# Patient Record
Sex: Male | Born: 1980 | Race: White | Hispanic: No | Smoking: Current every day smoker
Health system: Southern US, Community
[De-identification: ages and names within clinical notes are randomized; demographics above are authoritative.]

## PROBLEM LIST (undated history)

## (undated) DIAGNOSIS — K5090 Crohn's disease, unspecified, without complications: Secondary | ICD-10-CM

## (undated) HISTORY — PX: ABDOMINAL SURGERY: SHX537

---

## 2000-11-07 ENCOUNTER — Ambulatory Visit (HOSPITAL_COMMUNITY): Admission: RE | Admit: 2000-11-07 | Discharge: 2000-11-07 | Payer: Self-pay | Admitting: Gastroenterology

## 2000-11-07 ENCOUNTER — Encounter: Payer: Self-pay | Admitting: Gastroenterology

## 2004-04-28 ENCOUNTER — Inpatient Hospital Stay (HOSPITAL_COMMUNITY): Admission: EM | Admit: 2004-04-28 | Discharge: 2004-05-02 | Payer: Self-pay | Admitting: Emergency Medicine

## 2004-06-13 ENCOUNTER — Inpatient Hospital Stay (HOSPITAL_COMMUNITY): Admission: EM | Admit: 2004-06-13 | Discharge: 2004-06-22 | Payer: Self-pay

## 2004-06-15 ENCOUNTER — Encounter (INDEPENDENT_AMBULATORY_CARE_PROVIDER_SITE_OTHER): Payer: Self-pay | Admitting: *Deleted

## 2004-12-14 ENCOUNTER — Ambulatory Visit (HOSPITAL_COMMUNITY): Admission: RE | Admit: 2004-12-14 | Discharge: 2004-12-14 | Payer: Self-pay | Admitting: Emergency Medicine

## 2004-12-28 ENCOUNTER — Encounter (HOSPITAL_COMMUNITY): Admission: RE | Admit: 2004-12-28 | Discharge: 2005-03-28 | Payer: Self-pay | Admitting: Gastroenterology

## 2005-04-13 ENCOUNTER — Emergency Department (HOSPITAL_COMMUNITY): Admission: EM | Admit: 2005-04-13 | Discharge: 2005-04-14 | Payer: Self-pay | Admitting: Emergency Medicine

## 2005-04-16 ENCOUNTER — Encounter (HOSPITAL_COMMUNITY): Admission: RE | Admit: 2005-04-16 | Discharge: 2005-07-15 | Payer: Self-pay | Admitting: Gastroenterology

## 2005-04-21 ENCOUNTER — Ambulatory Visit (HOSPITAL_COMMUNITY): Admission: RE | Admit: 2005-04-21 | Discharge: 2005-04-21 | Payer: Self-pay | Admitting: Gastroenterology

## 2005-06-15 ENCOUNTER — Emergency Department (HOSPITAL_COMMUNITY): Admission: EM | Admit: 2005-06-15 | Discharge: 2005-06-16 | Payer: Self-pay | Admitting: Emergency Medicine

## 2005-06-30 ENCOUNTER — Encounter: Admission: RE | Admit: 2005-06-30 | Discharge: 2005-06-30 | Payer: Self-pay | Admitting: Gastroenterology

## 2005-08-31 ENCOUNTER — Encounter (HOSPITAL_COMMUNITY): Admission: RE | Admit: 2005-08-31 | Discharge: 2005-11-29 | Payer: Self-pay | Admitting: Gastroenterology

## 2006-01-13 ENCOUNTER — Encounter (HOSPITAL_COMMUNITY): Admission: RE | Admit: 2006-01-13 | Discharge: 2006-04-13 | Payer: Self-pay | Admitting: Gastroenterology

## 2006-04-14 ENCOUNTER — Emergency Department (HOSPITAL_COMMUNITY): Admission: EM | Admit: 2006-04-14 | Discharge: 2006-04-14 | Payer: Self-pay | Admitting: Emergency Medicine

## 2006-06-01 ENCOUNTER — Encounter (HOSPITAL_COMMUNITY): Admission: RE | Admit: 2006-06-01 | Discharge: 2006-06-02 | Payer: Self-pay | Admitting: Gastroenterology

## 2006-07-28 ENCOUNTER — Encounter (HOSPITAL_COMMUNITY): Admission: RE | Admit: 2006-07-28 | Discharge: 2006-10-26 | Payer: Self-pay | Admitting: Gastroenterology

## 2006-11-21 ENCOUNTER — Encounter (HOSPITAL_COMMUNITY): Admission: RE | Admit: 2006-11-21 | Discharge: 2007-02-07 | Payer: Self-pay | Admitting: Gastroenterology

## 2007-04-03 ENCOUNTER — Encounter (HOSPITAL_COMMUNITY): Admission: RE | Admit: 2007-04-03 | Discharge: 2007-07-02 | Payer: Self-pay | Admitting: Gastroenterology

## 2007-07-27 ENCOUNTER — Encounter (HOSPITAL_COMMUNITY): Admission: RE | Admit: 2007-07-27 | Discharge: 2007-10-25 | Payer: Self-pay | Admitting: Gastroenterology

## 2007-10-06 ENCOUNTER — Encounter: Admission: RE | Admit: 2007-10-06 | Discharge: 2007-10-06 | Payer: Self-pay | Admitting: Gastroenterology

## 2007-11-03 ENCOUNTER — Encounter (HOSPITAL_COMMUNITY): Admission: RE | Admit: 2007-11-03 | Discharge: 2008-02-01 | Payer: Self-pay | Admitting: Gastroenterology

## 2008-03-20 ENCOUNTER — Encounter (HOSPITAL_COMMUNITY): Admission: RE | Admit: 2008-03-20 | Discharge: 2008-06-18 | Payer: Self-pay | Admitting: Gastroenterology

## 2008-07-10 ENCOUNTER — Encounter (HOSPITAL_COMMUNITY): Admission: RE | Admit: 2008-07-10 | Discharge: 2008-10-08 | Payer: Self-pay | Admitting: Gastroenterology

## 2008-11-06 ENCOUNTER — Encounter (HOSPITAL_COMMUNITY): Admission: RE | Admit: 2008-11-06 | Discharge: 2009-02-04 | Payer: Self-pay | Admitting: Gastroenterology

## 2008-11-09 ENCOUNTER — Emergency Department (HOSPITAL_BASED_OUTPATIENT_CLINIC_OR_DEPARTMENT_OTHER): Admission: EM | Admit: 2008-11-09 | Discharge: 2008-11-09 | Payer: Self-pay | Admitting: Emergency Medicine

## 2008-11-09 ENCOUNTER — Ambulatory Visit: Payer: Self-pay | Admitting: Diagnostic Radiology

## 2008-11-25 ENCOUNTER — Ambulatory Visit: Payer: Self-pay | Admitting: Diagnostic Radiology

## 2008-11-25 ENCOUNTER — Emergency Department (HOSPITAL_BASED_OUTPATIENT_CLINIC_OR_DEPARTMENT_OTHER): Admission: EM | Admit: 2008-11-25 | Discharge: 2008-11-25 | Payer: Self-pay | Admitting: Emergency Medicine

## 2009-07-10 ENCOUNTER — Encounter: Admission: RE | Admit: 2009-07-10 | Discharge: 2009-07-10 | Payer: Self-pay | Admitting: Gastroenterology

## 2009-08-30 ENCOUNTER — Emergency Department (HOSPITAL_BASED_OUTPATIENT_CLINIC_OR_DEPARTMENT_OTHER): Admission: EM | Admit: 2009-08-30 | Discharge: 2009-08-30 | Payer: Self-pay | Admitting: Emergency Medicine

## 2010-01-01 ENCOUNTER — Ambulatory Visit: Payer: Self-pay | Admitting: Interventional Radiology

## 2010-01-01 ENCOUNTER — Emergency Department (HOSPITAL_BASED_OUTPATIENT_CLINIC_OR_DEPARTMENT_OTHER): Admission: EM | Admit: 2010-01-01 | Discharge: 2010-01-01 | Payer: Self-pay | Admitting: Emergency Medicine

## 2010-07-25 ENCOUNTER — Encounter: Payer: Self-pay | Admitting: Gastroenterology

## 2010-07-29 ENCOUNTER — Emergency Department (HOSPITAL_COMMUNITY)
Admission: EM | Admit: 2010-07-29 | Discharge: 2010-07-29 | Payer: Self-pay | Source: Home / Self Care | Admitting: Emergency Medicine

## 2010-07-29 LAB — URINALYSIS, ROUTINE W REFLEX MICROSCOPIC
Bilirubin Urine: NEGATIVE
Hgb urine dipstick: NEGATIVE
Ketones, ur: NEGATIVE mg/dL
Nitrite: NEGATIVE
Specific Gravity, Urine: 1.017 (ref 1.005–1.030)
Urine Glucose, Fasting: NEGATIVE mg/dL
Urobilinogen, UA: 0.2 mg/dL (ref 0.0–1.0)
pH: 7.5 (ref 5.0–8.0)

## 2010-07-29 LAB — CBC
MCHC: 34 g/dL (ref 30.0–36.0)
MCV: 86.3 fL (ref 78.0–100.0)
Platelets: 444 10*3/uL — ABNORMAL HIGH (ref 150–400)
RBC: 4.67 MIL/uL (ref 4.22–5.81)
RDW: 14.2 % (ref 11.5–15.5)
WBC: 16.3 10*3/uL — ABNORMAL HIGH (ref 4.0–10.5)

## 2010-07-29 LAB — COMPREHENSIVE METABOLIC PANEL
ALT: 13 U/L (ref 0–53)
AST: 17 U/L (ref 0–37)
Albumin: 3.7 g/dL (ref 3.5–5.2)
BUN: 13 mg/dL (ref 6–23)
CO2: 24 mEq/L (ref 19–32)
Calcium: 9.5 mg/dL (ref 8.4–10.5)
Chloride: 105 mEq/L (ref 96–112)
Creatinine, Ser: 0.75 mg/dL (ref 0.4–1.5)
GFR calc Af Amer: >60 mL/min (ref >60)
GFR calc non Af Amer: >60 mL/min (ref >60)
Glucose, Bld: 112 mg/dL — ABNORMAL HIGH (ref 70–99)
Potassium: 4.4 mEq/L (ref 3.5–5.1)
Sodium: 138 mEq/L (ref 135–145)
Total Bilirubin: 0.6 mg/dL (ref 0.3–1.2)
Total Protein: 7.1 g/dL (ref 6.0–8.3)

## 2010-07-29 LAB — DIFFERENTIAL
Basophils Absolute: 0 10*3/uL (ref 0.0–0.1)
Basophils Relative: 0 % (ref 0–1)
Eosinophils Absolute: 0.1 10*3/uL (ref 0.0–0.7)
Eosinophils Relative: 0 % (ref 0–5)
Lymphs Abs: 1.1 10*3/uL (ref 0.7–4.0)
Monocytes Absolute: 0.9 10*3/uL (ref 0.1–1.0)
Monocytes Relative: 5 % (ref 3–12)
Neutro Abs: 14.2 10*3/uL — ABNORMAL HIGH (ref 1.7–7.7)

## 2010-08-07 ENCOUNTER — Emergency Department (HOSPITAL_COMMUNITY): Payer: BC Managed Care – PPO

## 2010-08-07 ENCOUNTER — Inpatient Hospital Stay (HOSPITAL_COMMUNITY)
Admission: EM | Admit: 2010-08-07 | Discharge: 2010-08-10 | DRG: 179 | Disposition: A | Discharge status: Home / Self Care | Payer: BC Managed Care – PPO | Attending: Internal Medicine | Admitting: Internal Medicine

## 2010-08-07 DIAGNOSIS — F17200 Nicotine dependence, unspecified, uncomplicated: Secondary | ICD-10-CM | POA: Diagnosis present

## 2010-08-07 DIAGNOSIS — D72829 Elevated white blood cell count, unspecified: Secondary | ICD-10-CM | POA: Diagnosis present

## 2010-08-07 DIAGNOSIS — K5090 Crohn's disease, unspecified, without complications: Principal | ICD-10-CM | POA: Diagnosis present

## 2010-08-07 LAB — URINALYSIS, ROUTINE W REFLEX MICROSCOPIC
Bilirubin Urine: NEGATIVE
Hgb urine dipstick: NEGATIVE
Ketones, ur: NEGATIVE mg/dL
Nitrite: NEGATIVE
Urine Glucose, Fasting: NEGATIVE mg/dL
pH: 7 (ref 5.0–8.0)

## 2010-08-07 LAB — CBC
HCT: 40.9 % (ref 39.0–52.0)
Hemoglobin: 13.4 g/dL (ref 13.0–17.0)
MCH: 29.1 pg (ref 26.0–34.0)
MCHC: 32.8 g/dL (ref 30.0–36.0)
MCV: 88.7 fL (ref 78.0–100.0)
Platelets: 385 10*3/uL (ref 150–400)
RBC: 4.61 MIL/uL (ref 4.22–5.81)
RDW: 15.6 % — ABNORMAL HIGH (ref 11.5–15.5)
WBC: 21.2 10*3/uL — ABNORMAL HIGH (ref 4.0–10.5)

## 2010-08-07 LAB — COMPREHENSIVE METABOLIC PANEL
ALT: 31 U/L (ref 0–53)
Alkaline Phosphatase: 51 U/L (ref 39–117)
CO2: 29 mEq/L (ref 19–32)
Chloride: 102 mEq/L (ref 96–112)
GFR calc non Af Amer: >60 mL/min (ref >60)
Glucose, Bld: 92 mg/dL (ref 70–99)
Potassium: 4.3 mEq/L (ref 3.5–5.1)
Sodium: 138 mEq/L (ref 135–145)
Total Bilirubin: 0.4 mg/dL (ref 0.3–1.2)

## 2010-08-07 LAB — DIFFERENTIAL
Eosinophils Absolute: 0.6 10*3/uL (ref 0.0–0.7)
Eosinophils Relative: 3 % (ref 0–5)
Lymphs Abs: 2.3 10*3/uL (ref 0.7–4.0)
Monocytes Absolute: 2.2 10*3/uL — ABNORMAL HIGH (ref 0.1–1.0)
Monocytes Relative: 10 % (ref 3–12)
Neutro Abs: 16 10*3/uL — ABNORMAL HIGH (ref 1.7–7.7)
Neutrophils Relative %: 76 % (ref 43–77)

## 2010-08-07 LAB — RAPID STREP SCREEN (MED CTR MEBANE ONLY): Streptococcus, Group A Screen (Direct): NEGATIVE

## 2010-08-07 LAB — LIPASE, BLOOD: Lipase: 32 U/L (ref 11–59)

## 2010-08-07 MED ORDER — IOHEXOL 300 MG/ML  SOLN: 100.0000 mL | Freq: Once | INTRAMUSCULAR | Status: AC | PRN

## 2010-08-08 LAB — BASIC METABOLIC PANEL
BUN: 16 mg/dL (ref 6–23)
Creatinine, Ser: 0.8 mg/dL (ref 0.4–1.5)
GFR calc non Af Amer: >60 mL/min (ref >60)
Glucose, Bld: 115 mg/dL — ABNORMAL HIGH (ref 70–99)

## 2010-08-15 NOTE — Consult Note (Signed)
NAME:  Roy Lopez, HAUSER                  ACCOUNT NO.:  [id number]  MEDICAL RECORD NO.:  [id number]           PATIENT TYPE:  I  LOCATION:  5126                         FACILITY:  [facility]  PHYSICIAN:  Gabrielle Dare. Janee Morn, M.D.DATE OF BIRTH:  Jun 22, 1981  DATE OF CONSULTATION:  08/08/2010 DATE OF DISCHARGE:                                CONSULTATION   REASON FOR CONSULTATION:  Crohn's disease with bowel obstruction.  HISTORY OF PRESENT ILLNESS:  Roy Lopez is a 30 year old white male with a 15-plus year history of Crohn's disease.  He presents to the emergency department today with gradually increasing lower abdominal pain.  He had some blood with bowel movements, but he has had no diarrhea.  This patient has had progressive difficulties over the past several weeks. He was managed for a long time by Dr. Sharrell Ku; however, he recently changed to Dr. Toma Copier at [facility] GI service.  His most recent medical regimen includes Entocort and azathioprine as well as prednisone taper.  He was prescribed Flagyl and Cipro at a recent ED visit.  He returned today to the emergency department.  CT scan was done at that time, demonstrating acute inflammation in right lower quadrant small bowel loops, consistent with active Crohn's enteritis.  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 There is no perforation or abscess formation.  There is partial bowel obstruction with some dilation of small bowel loops.  PAST MEDICAL HISTORY:  Crohn's disease.  PAST SURGICAL HISTORY:  In December 2005, he underwent small bowel resection x3 and stricturoplasty, with small bowel x4 by Dr. Glenna Fellows from my practice.  SOCIAL HISTORY:  He smokes cigarettes.  He occasionally drinks alcohol. 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  MEDICATIONS:  As listed  above.  ALLERGIES:  No known drug allergies.  REVIEW OF SYSTEMS:  GI:  As above.  PHYSICAL EXAMINATION:  VITAL SIGNS:  Temperature 98.1, blood pressure 109/64, heart rate 59, respirations 18, and saturation is 99% on room air.  GENERAL:  The patient is awake and alert.  He appears stated age. He is in no distress. HEENT:  He has mild lip edema. LUNGS:  Clear to auscultation. CARDIOVASCULAR:  The heart is regular with no murmurs.  Pulses palpable in the left chest. ABDOMEN:  Soft.  There are active bowel sounds.  He has minimal lower abdominal tenderness with no guarding.  There is no evidence of peritonitis and no masses are felt. EXTREMITIES:  No edema or deformity. SKIN:  Warm and dry.  IMAGING AND LABORATORY DATA:  CT scan, please see above.  White blood cell count 21.2.  Urinalysis negative.  Liver function tests normal. Lipase 32.  Hemoglobin 13.4.  IMPRESSION:  Crohn's disease with exacerbation and small bowel entero- entero fistulae and small bowel obstruction.  RECOMMENDATIONS: 1. Bowel rest. 2. GI evaluation. 3. Medical management for now. 4. We will follow along closely with you.  Plan was discussed in     detail with the patient.  Gabrielle Dare Janee Morn, M.D.     BET/MEDQ  D:  08/08/2010  T:  08/08/2010  Job:  [id number]  Electronically Signed by Violeta Gelinas M.D. on 08/09/2010 04:54:09 PM

## 2010-08-19 NOTE — Discharge Summary (Signed)
NAME:  Roy Lopez, Roy Lopez NO.:  [id number]  MEDICAL RECORD NO.:  [id number]           PATIENT TYPE:  LOCATION:                                 FACILITY:  PHYSICIAN:  Lonia Blood, M.D.       DATE OF BIRTH:  Nov 15, 1980  DATE OF ADMISSION: DATE OF DISCHARGE:                              DISCHARGE SUMMARY   PRIMARY CARE PROVIDER:  Dr. Derrell Lolling in [place], [place].  DISCHARGE DIAGNOSES: 1. Crohn disease flare. 2. Partial small bowel obstruction, resolved. 3. Constipation, resolved. 4. Status post multiple small bowel resections and stricturoplasty by     Dr. Glenna Fellows in December 2005.  DISCHARGE MEDICATIONS: 1. Entocort 9 mg daily. 2. Imuran 50 mg twice a day. 3. Colace 100 mg twice a day. 4. MiraLax 34 g daily as needed for constipation. 5. Nicotine patch daily. 6. Oxycodone/APAP 5/325 one to two tablets every 4 hours as needed for     pain. 7. Prednisone 10 mg three times a day.  The patient was instructed to     take 20 mg three times a day of prednisone for 4 days and then go     back to his regular dose of 10 mg three times a day.  CONDITION ON DISCHARGE:  [name] was discharged in good condition, tolerating a regular diet, alert, oriented, afebrile, passing good bowel movements, no nausea, no vomiting, no abdominal pain.  He will follow up with his primary gastroenterologist, Dr. Gaspar Garbe, at [facility].  PROCEDURE DURING THIS ADMISSION:  On August 07, 2010, the patient underwent a CT scan of abdomen and pelvis with oral intravenous contrast showing active inflammation involving the right lower quadrant and small bowel loops consistent with Crohn enteritis, evidence of small bowel stricturing with interloop fistula formation, no perforation or abscess, partial small bowel obstruction with abnormal dilatation of the proximal small bowel loops.  CONSULTATION DURING THIS ADMISSION:  The patient  was seen in consultation by Dr. Violeta Gelinas from General Surgery and Dr. Charlott Rakes from [facility] Gastroenterology.  HISTORY AND PHYSICAL:  Refer to dictated H and P which was done by Dr. Della Goo.  HOSPITAL COURSE:  Mr. Going is a 30 year old gentleman with long-acting Crohn disease who presents to emergency room with complaints of increasing abdominal pain, obstipation.  His CT of abdomen revealed presence of partial small bowel obstruction.  The patient was placed on bowel rest, and he was started on intravenous steroids.  He had a quick course of improvement with decrease of his pain, resolution of his symptoms of obstipation.  He was placed on MiraLax which resulted in his constipation clearing away and him having a few good bowel movements. His diet was advanced to a regular diet, and he was discharged in good condition on August 10, 2010.  The patient was instructed to follow up with his primary gastroenterologist, Dr. Gaspar Garbe at the [facility].     Lonia Blood, M.D.  SL/MEDQ  D:  08/11/2010  T:  08/12/2010  Job:  [id number]  cc:   Dr. Gaspar Garbe Dr. Derrell Lolling in [facility]  Electronically Signed by Lonia Blood M.D. on 08/19/2010 05:51:51 PM

## 2010-08-23 ENCOUNTER — Emergency Department (HOSPITAL_BASED_OUTPATIENT_CLINIC_OR_DEPARTMENT_OTHER)
Admission: EM | Admit: 2010-08-23 | Discharge: 2010-08-23 | Disposition: A | Discharge status: Other Acute Inpatient Hospital | Payer: BC Managed Care – PPO | Source: Home / Self Care | Attending: Emergency Medicine | Admitting: Emergency Medicine

## 2010-08-23 ENCOUNTER — Emergency Department (INDEPENDENT_AMBULATORY_CARE_PROVIDER_SITE_OTHER): Payer: BC Managed Care – PPO

## 2010-08-23 ENCOUNTER — Emergency Department (HOSPITAL_COMMUNITY)
Admission: EM | Admit: 2010-08-23 | Discharge: 2010-08-23 | Disposition: A | Discharge status: Nursing Home (Includes ICF) | Payer: BC Managed Care – PPO | Attending: Emergency Medicine | Admitting: Emergency Medicine

## 2010-08-23 DIAGNOSIS — K5090 Crohn's disease, unspecified, without complications: Secondary | ICD-10-CM | POA: Insufficient documentation

## 2010-08-23 DIAGNOSIS — R109 Unspecified abdominal pain: Secondary | ICD-10-CM | POA: Insufficient documentation

## 2010-08-23 DIAGNOSIS — F17200 Nicotine dependence, unspecified, uncomplicated: Secondary | ICD-10-CM | POA: Insufficient documentation

## 2010-08-23 DIAGNOSIS — K56609 Unspecified intestinal obstruction, unspecified as to partial versus complete obstruction: Secondary | ICD-10-CM | POA: Insufficient documentation

## 2010-08-23 DIAGNOSIS — K5989 Other specified functional intestinal disorders: Secondary | ICD-10-CM

## 2010-08-23 DIAGNOSIS — Z79899 Other long term (current) drug therapy: Secondary | ICD-10-CM | POA: Insufficient documentation

## 2010-08-23 LAB — DIFFERENTIAL
Eosinophils Absolute: 0.3 10*3/uL (ref 0.0–0.7)
Lymphs Abs: 1.8 10*3/uL (ref 0.7–4.0)
Monocytes Absolute: 1.6 10*3/uL — ABNORMAL HIGH (ref 0.1–1.0)
Neutrophils Relative %: 77 % (ref 43–77)

## 2010-08-23 LAB — COMPREHENSIVE METABOLIC PANEL
Albumin: 4.1 g/dL (ref 3.5–5.2)
Alkaline Phosphatase: 81 U/L (ref 39–117)
BUN: 18 mg/dL (ref 6–23)
Calcium: 9.4 mg/dL (ref 8.4–10.5)
Glucose, Bld: 80 mg/dL (ref 70–99)
Potassium: 4.1 mEq/L (ref 3.5–5.1)
Sodium: 144 mEq/L (ref 135–145)
Total Protein: 7.2 g/dL (ref 6.0–8.3)

## 2010-08-23 LAB — CBC
MCHC: 33.7 g/dL (ref 30.0–36.0)
MCV: 88.8 fL (ref 78.0–100.0)
Platelets: 352 10*3/uL (ref 150–400)
RDW: 16.1 % — ABNORMAL HIGH (ref 11.5–15.5)
WBC: 16.3 10*3/uL — ABNORMAL HIGH (ref 4.0–10.5)

## 2010-08-23 LAB — LIPASE, BLOOD: Lipase: 48 U/L (ref 23–300)

## 2010-08-23 MED ORDER — IOHEXOL 300 MG/ML  SOLN
100.0000 mL | Freq: Once | INTRAMUSCULAR | Status: AC | PRN
  Administered 2010-08-23: 100 mL via INTRAVENOUS

## 2010-09-01 NOTE — Consult Note (Signed)
NAME:  Roy Lopez, Roy Lopez                  ACCOUNT NO.:  [id number]  MEDICAL RECORD NO.:  [id number]           PATIENT TYPE:  E  LOCATION:  [facility]                         FACILITY:  [facility]  PHYSICIAN:  Geno Sydnor A. Breckin Zafar, M.D.DATE OF BIRTH:  1980-10-08  DATE OF CONSULTATION:  08/23/2010 DATE OF DISCHARGE:                                CONSULTATION   REQUESTING PHYSICIAN:  Dr. Anitra Lauth at [facility] Emergency [facility].  PRIMARY CARE DOCTOR:  Dr. Chesley Mires at [facility], Gastroenterology.  CHIEF COMPLAINT:  Abdominal pain.  HISTORY OF PRESENT ILLNESS:  The patient is a 30 year old male with 1 day history of severe abdominal pain that started at about lunch time yesterday.  He has been having intermittent bouts of abdominal pain, has history of Crohn disease at least diagnosed 7 years ago.  Back in 2005, he had a he says part of his colon removed and small bowel.  I have no records of that currently available to review.  Any event, he has been seeing Dr. Chesley Mires at [facility], Gastroenterology Clinic and he will be getting a workup for his abdominal pain with some tests that will be done this week.  It was unclear exactly what they were but it sounds like a small-bowel follow-through from his description.  Any event, he was complaining of 10/10 pain located in his left upper quadrant epigastrium associated with nausea, vomiting, and diarrhea.  He had a CT done at [facility] there which showed obstructive pattern with a decompressed distal colon that massively dilated small bowel with some swelling at the SMA and the SMV.  Also there appeared to be abnormalities of duodenum upon my review and some concern of possible internal herniation there.  Any event, he had a white count of 16,000.  Dr. Anitra Lauth contacted by Dr. Donell Beers and requested consultation here at [facility] and the patient was sent from [facility].  PAST  MEDICAL HISTORY:  Crohn's at least x7 years, mononucleosis.  PAST SURGICAL HISTORY:  Exploratory laparotomy, bowel resection back in 2005.  FAMILY HISTORY:  Noncontributory.  SOCIAL HISTORY:  He does smoke, he does drink, use drugs but he stopped doing that.  He is single.  REVIEW OF SYSTEMS:  Positive for abdominal pain.  Positive for nausea, positive for vomiting.  Positive for abdominal distention, otherwise negative x15.  MEDICATIONS:  Azathioprine, Entocort, and Vicodin.  ALLERGIES:  CIPRO, FLAGYL.  PHYSICAL EXAMINATION:  VITAL SIGNS:  Temperature is 98, pulse 82, blood pressure 105/61. GENERAL APPEARANCE:  Pleasant male, comfortable, NG tube in place.  No apparent distress. HEENT:  No jaundice.  Oropharynx is moist. NECK:  Supple, nontender.  Trachea midline.  No masses. PULMONARY:  Lung sounds are clear.  Chest wall excursion and work of breathing are normal. CARDIOVASCULAR:  Regular rate and rhythm without rub, murmur, or gallop. EXTREMITIES:  Warm, well-perfused, normal distal pulse exam. ABDOMEN:  Distended, tender in the epigastrium but he received Dilaudid during the last hour, no rigidity though.  It is tender throughout but mostly in left upper quadrant.  No  hernia.  No mass.  Bowel sounds are present, normoactive. EXTREMITIES:  No clubbing, cyanosis nor edema.  Muscle tone and range of motion appear grossly normal. SKIN:  Covered with multiple tattoos. NEURO:  Glasgow coma scale is 15.  Motor and sensory function are grossly intact. PSYCHIATRIC:  Mood and affect are normal.  He is appropriate.  DIAGNOSTIC STUDIES:  Reviewed his abdominal and pelvic CT scan myself this morning which showed massive dilation of small bowel and colon up through the transverse colon.  The duodenum though has typical appearance to it and some swelling around the superior mesenteric vein and artery.  There is some concern that there may be a small internal hernia there, obstruction  there.  No free fluid.  No free air.  Labs show white count of 16,000, hemoglobin 14.9, platelet count 357,000.  Sodium 144, potassium 4.1, chloride 103, CO2 30, BUN 18, creatinine 1, glucose 80, albumin 4.1.  LFTs are normal.  IMPRESSION:  Severe abdominal pain, history of Crohn's, and what appears to be small-bowel obstruction versus internal hernia with leukocytosis.  PLAN:  Given the high white count and CT findings, I am worried for potential internal hernia or small-bowel obstruction.  He has been having chronic abdominal pain for number of weeks, now it is exacerbated and it is quite severe at this point in time.  I do not think any further workup would be beneficial and given the fact that we are worried about internal hernia, exploratory laparotomy with possible lysis of adhesions, small bowel resection, and possible ostomy are all necessary I think to consider at this point in time.  I have discussed this with the patient.  Risk of bleeding, infection, wound problems, recurrent disease, missing segments of Crohn disease that may have to reoperated on, possible ileostomy or colostomy, possible injury to other internal organs to include but not exclusive of liver, stomach small- bowel, large bowel, kidneys, ureters, bladder, major blood vessels were all possibilities of surgical exploration.  I do not think there is any alternative therapy given the fact his white count is going up, his pain med requirements are going up, I do not feel observation for the testing is appropriate.  He understands all the above and wishes to proceed.     Caroljean Monsivais A. Vaniyah Lansky, M.D.     TAC/MEDQ  D:  08/23/2010  T:  08/23/2010  Job:  [id number]  cc:   Dr. Chesley Mires  Electronically Signed by Harriette Bouillon M.D. on 09/01/2010 08:43:55 AM

## 2010-09-07 NOTE — H&P (Signed)
NAME:  Roy Lopez, Roy Lopez NO.:  [id number]  MEDICAL RECORD NO.:  [id number]           PATIENT TYPE:  I  LOCATION:  5126                         FACILITY:  [facility]  PHYSICIAN:  Della Goo, M.D. DATE OF BIRTH:  [birth year]  DATE OF ADMISSION:  08/07/2010 DATE OF DISCHARGE:                             HISTORY & PHYSICAL   PRIMARY CARE PHYSICIAN:  Dr. Derrell Lolling in [place], [place].  CHIEF COMPLAINT:  Fever, abdominal pain, swollen glands.  HISTORY OF PRESENT ILLNESS:  This is a 30 year old male with a history of Crohn disease who presents to the emergency department after complaints of 10 days of abdominal pain, cramping in the left lower quadrant.  The patient reports as well having bloody stools for about 3 days consistent with his Crohn flare-up.  He states that he has had bloating, denies having any nausea or vomiting.  He was seen in the emergency department 1 week ago, was evaluated and placed on ciprofloxacin and Flagyl therapy.  His symptoms did not improve.  He was also placed on a steroid taper.  The patient continued to have problems and worsening.  He was seen in the emergency department this evening, evaluated and along with these symptoms, he also reports having swelling of his face, neck, glands, sore throat and tongue swelling.  He also states that 1 month ago he had dental work done on a tooth on the lower right side of the jaw.  He was placed on amoxicillin antibiotics at that time.  He also states that he has had fragments that have arisen from the area of the tooth excision.  The amoxicillin was for a dental abscess.  In the emergency department, the patient was evaluated for the swollen glands as well and sore throat and a strep test was performed which was found to be negative.  The patient also reports in the past that he has had mononucleosis but recovered from it.  In the emergency department, the patient underwent a CT scan of  the abdomen and pelvis, results of which found inflammation consistent with Crohn disease and a bowel obstruction.  The general surgeon on-call Dr. Violeta Gelinas was apprised of the case and consulted and is to see the patient.  The patient was referred for medical admission.  PAST MEDICAL HISTORY:  Significant for Crohn disease, also previous history of a bowel obstruction status post bowel resection of 12 inches and recent admission to [facility] for benzodiazepine and opioid dependence 21 days ago.  MEDICATIONS:  Entocort EC, azathioprine and prednisone.  ALLERGIES:  Questionable allergy to either CIPRO or FLAGYL.  Before this no known previous drug allergies.  SOCIAL HISTORY:  The patient is a smoker.  He states that he smokes anywhere from one-third a pack of cigarettes to one pack of cigarettes and has smoked for 6 years.  He denies any alcohol usage.  He denies any illicit drug usage.  FAMILY HISTORY:  Positive for inflammatory bowel disease in his mother. No history of hypertension, diabetes, or coronary artery disease or cancer in his family that he knows of.  REVIEW  OF SYSTEMS:  Pertinents mentioned above.  PHYSICAL EXAMINATION FINDINGS:  GENERAL:  This is a 30 year old well- nourished, well-developed Caucasian male in discomfort but no acute distress. VITAL SIGNS:  Temperature of 98.7, blood pressure 118/76, heart rate 75, respirations 18, O2 sats 97%. HEENT:  Normocephalic, atraumatic.  Pupils are equally round and reactive to light.  Extraocular movements are intact.  Funduscopic benign.  There is no scleral icterus.  Nares are patent bilaterally. Oropharynx is clear. NECK:  Supple.  Full range of motion.  No thyromegaly, adenopathy, or jugular venous distention. CARDIOVASCULAR:  Regular rate and rhythm.  No murmurs, gallops or rubs appreciated. LUNGS:  Clear to auscultation bilaterally.  No rales, rhonchi or wheezes. ABDOMEN:  Positive bowel sounds, soft,  nontender, nondistended.  No hepatosplenomegaly.  No rebound.  No guarding.  EXTREMITIES:  Without cyanosis, clubbing or edema. SKIN:  Multiple tattoos along the lower abdomen area. NEUROLOGIC:  Nonfocal.  LABORATORY STUDIES:  White blood cell count 21.2, hemoglobin 13.4, hematocrit 40.9, MCV 88.7, platelets 385, neutrophils 76%, lymphocytes 11%.  Urine analysis negative.  Sodium 138, potassium 4.3, chloride 102, CO2 29, BUN 18, creatinine 0.79, glucose 92, lipase 32.  CT scan of the abdomen and pelvis reveals inflammation along the right lower quadrant, small bowel loops consistent with active Crohn's enteritis, small-bowel stricturing and penetrating disease with interloop fistula formation, partial small bowel obstruction with abnormal dilatation of the proximal small bowel loops.  Also of note, there is striation of the left kidney suggestive of pyelonephritis but please note the urinalysis is negative. Group A strep screen negative.  Mono screen negative.  ASSESSMENT:  A 30 year old male being admitted with 1. Small bowel obstruction secondary to Crohn disease flare-up. 2. Crohn disease flare. 3. Abdominal pain. 4. Leukocytosis secondary to steroid therapy versus infection.     However, there is a normal white blood cell differential. 5. Lower jaw and mouth swelling, allergic reaction versus dental     abscess.  PLAN:  The patient will be admitted.  He will be admitted for further evaluation and treatment of his small bowel obstruction and abdominal pain.  The patient has been made n.p.o. and the patient has been placed on IV fluids for maintenance therapy and pain control therapy has been ordered as well as antiemetic therapy as needed.  An IV steroid taper has also been initiated.  PLAN:  A CT scan of the maxillofacial area with emphasis on the mandible will be ordered to evaluate for possible abscess formation.  Pending these results, further antibiotic therapy will be  implemented.  The patient will be monitored for further changes, further swelling.  The swelling may be due to an allergic reaction to one of the recent antibiotic therapies of Cipro or Flagyl.  The patient will be monitored for further progression of the swelling.  The patient will be placed on GI and DVT prophylaxis.    Della Goo, M.D.    HJ/MEDQ  D:  08/08/2010  T:  08/08/2010  Job:  [id number]  Electronically Signed by Della Goo M.D. on 09/07/2010 08:10:19 PM

## 2010-10-13 LAB — CBC
HCT: 38.7 % — ABNORMAL LOW (ref 39.0–52.0)
Hemoglobin: 12.8 g/dL — ABNORMAL LOW (ref 13.0–17.0)
MCV: 81.4 fL (ref 78.0–100.0)
Platelets: 420 10*3/uL — ABNORMAL HIGH (ref 150–400)
RBC: 4.75 MIL/uL (ref 4.22–5.81)
WBC: 9.9 10*3/uL (ref 4.0–10.5)

## 2010-10-13 LAB — COMPREHENSIVE METABOLIC PANEL
Albumin: 3.6 g/dL (ref 3.5–5.2)
Alkaline Phosphatase: 70 U/L (ref 39–117)
BUN: 14 mg/dL (ref 6–23)
CO2: 32 mEq/L (ref 19–32)
Chloride: 100 mEq/L (ref 96–112)
Creatinine, Ser: 0.8 mg/dL (ref 0.4–1.5)
GFR calc non Af Amer: >60 mL/min (ref >60)
Glucose, Bld: 86 mg/dL (ref 70–99)
Potassium: 4.2 mEq/L (ref 3.5–5.1)
Total Bilirubin: 0.6 mg/dL (ref 0.3–1.2)

## 2010-10-13 LAB — DIFFERENTIAL
Basophils Absolute: 0.1 10*3/uL (ref 0.0–0.1)
Basophils Relative: 1 % (ref 0–1)
Lymphocytes Relative: 21 % (ref 12–46)
Monocytes Absolute: 0.7 10*3/uL (ref 0.1–1.0)
Neutro Abs: 6.5 10*3/uL (ref 1.7–7.7)
Neutrophils Relative %: 66 % (ref 43–77)

## 2010-10-13 LAB — LIPASE, BLOOD: Lipase: 36 U/L (ref 23–300)

## 2010-10-31 ENCOUNTER — Emergency Department (INDEPENDENT_AMBULATORY_CARE_PROVIDER_SITE_OTHER): Payer: BC Managed Care – PPO

## 2010-10-31 ENCOUNTER — Emergency Department (HOSPITAL_BASED_OUTPATIENT_CLINIC_OR_DEPARTMENT_OTHER)
Admission: EM | Admit: 2010-10-31 | Discharge: 2010-11-01 | Disposition: A | Discharge status: Home / Self Care | Payer: BC Managed Care – PPO | Attending: Emergency Medicine | Admitting: Emergency Medicine

## 2010-10-31 DIAGNOSIS — R109 Unspecified abdominal pain: Secondary | ICD-10-CM

## 2010-10-31 DIAGNOSIS — K5090 Crohn's disease, unspecified, without complications: Secondary | ICD-10-CM | POA: Insufficient documentation

## 2010-10-31 LAB — COMPREHENSIVE METABOLIC PANEL
AST: 19 U/L (ref 0–37)
Albumin: 4.1 g/dL (ref 3.5–5.2)
Calcium: 9.1 mg/dL (ref 8.4–10.5)
Creatinine, Ser: 0.9 mg/dL (ref 0.4–1.5)
GFR calc Af Amer: >60 mL/min (ref >60)
Sodium: 143 mEq/L (ref 135–145)
Total Protein: 7.2 g/dL (ref 6.0–8.3)

## 2010-10-31 LAB — CBC
MCH: 29.2 pg (ref 26.0–34.0)
MCHC: 33.5 g/dL (ref 30.0–36.0)
Platelets: 317 10*3/uL (ref 150–400)
RBC: 4.52 MIL/uL (ref 4.22–5.81)

## 2010-10-31 LAB — URINALYSIS, ROUTINE W REFLEX MICROSCOPIC
Bilirubin Urine: NEGATIVE
Glucose, UA: NEGATIVE mg/dL
Hgb urine dipstick: NEGATIVE
Ketones, ur: NEGATIVE mg/dL
Nitrite: NEGATIVE
Protein, ur: NEGATIVE mg/dL
Specific Gravity, Urine: 1.016 (ref 1.005–1.030)
Urobilinogen, UA: 1 mg/dL (ref 0.0–1.0)
pH: 6 (ref 5.0–8.0)

## 2010-10-31 LAB — DIFFERENTIAL
Basophils Absolute: 0 10*3/uL (ref 0.0–0.1)
Basophils Relative: 0 % (ref 0–1)
Eosinophils Absolute: 0.6 10*3/uL (ref 0.0–0.7)
Monocytes Absolute: 0.8 10*3/uL (ref 0.1–1.0)
Monocytes Relative: 10 % (ref 3–12)
Neutrophils Relative %: 56 % (ref 43–77)

## 2010-11-20 NOTE — H&P (Signed)
NAME:  Roy Lopez, Roy Lopez                  ACCOUNT NO.:  [id number]   MEDICAL RECORD NO.:  [id number]          PATIENT TYPE:  INP   LOCATION:  1827                         FACILITY:  [facility]   PHYSICIAN:  Hillery Aldo, M.D.   DATE OF BIRTH:  1980/11/07   DATE OF ADMISSION:  04/27/2004  DATE OF DISCHARGE:                                HISTORY & PHYSICAL   CHIEF COMPLAINT:  Abdominal pain and fever.   HISTORY OF PRESENT ILLNESS:  The patient is a 30 year old male with a  longstanding history of Crohn's disease who presents with a two-day history  of increasing abdominal cramps, fever and nausea.  The cramps have gotten  worse over the past two days.  He rates the pain as a 9/10 and is crampy and  sharp in nature.  Patient states his last bowel movement was approximately  two days ago and he has noticed that he has no passed any flatus in the last  24 hours.  Nothing makes the pain better.  He has not been able to eat,  although he states he has not vomited.   PAST MEDICAL HISTORY:  1.  Crohn's disease diagnosed in 1997.  2.  History of gastritis.  3.  History of iron-deficiency anemia, resolved.  4.  History of esophagea; stricture status post dilatation.  5.  History of childhood asthma.  6.  History of left inguinal hernia repair.   ALLERGIES:  No known drug allergies.   MEDICATIONS:  1.  Entocort 3 mg daily.  2.  Zelnorm 6 mg daily.  3.  Centrum multivitamin daily.  4.  Azathioprine 100 mg b.i.d.  5.  Folic acid 1 mg daily.  6.  Pentasa 8 tablets b.i.d.   FAMILY HISTORY:  There is no family history of diabetes, colon cancer,  coronary artery disease, hypertension, hyperlipidemia, or Crohn's disease.   SOCIAL HISTORY:  Patient lives with family.  He sporadically smokes  cigarettes.  He uses occasional alcohol.   REVIEW OF SYSTEMS:  As per HPI.  All other review of systems are viewed and  found to be negative.   PHYSICAL EXAMINATION:  VITAL SIGNS:  Temperature 100.7, pulse  93,  respirations 16. Blood pressure 115/71, and O2 saturation 98% on room air.  GENERAL APPEARANCE:  In general this is well-developed, well-nourished male  in no apparent distress.  HEENT:  Normocephalic and atraumatic.  PERRL.  EOMI.  A nasogastric tube is  in place.  Oropharynx is clear.  NECK:  The neck is supple.  No thyromegaly.  No lymphadenopathy.  No jugular  venous distention.  CHEST:  Lungs are clear to auscultation bilaterally.  Good air movement.  HEART:  Regular rate and rhythm.  No murmurs, rubs or gallops.  ABDOMEN:  The abdomen is soft.  Sluggish, but present bowel sounds.  He is  slightly distended, but does not guard on exam.  RECTAL:  As per the ED physicians.  Stool is heme positive.  EXTREMITIES:  No clubbing, edema or cyanosis.  SKIN:  The skin is warm and dry to touch.  No  rashes.  NEUROLOGICAL:  Nonfocal.  PSYCHIATRIC:  Appropriate.   LABORATORY AND RADIOGRAPHIC DATA:  CBC showed a white blood cell count of  8.5, hemoglobin 14.5, hematocrit 42.7 and platelet count 404,000 with an  absolute neutrophil count of 6.4.  Urinalysis was normal, except for 15mg /dL  of ketones.  Chemistries showed sodium 137, potassium 3.8, chloride 102,  bicarb 26, BUN 13, creatinine 1.1, and glucose 100.  CT scan of the abdomen  showed distal small bowel obstruction due to thickening of small bowel loops  consistent with active Crohn's disease.  There was a small amount of free  fluid in the pelvis.  No abscess or free air was observed.   ASSESSMENT AND PLAN:  1.  Active Crohn's disease.  The patient will be started on broad spectrum antibiotic coverage with Cipro  and Flagyl.  In addition, we will start him on Solu-Medrol 80 mg  intravenously q.8.  we will consult gastroenterology tomorrow for further  evaluation and management; and, hold all p.o. medications for now.  He will  continue on bowel rest and we will keep the patient NPO, except for ice  chips.  Additionally, we will  start a proton pump inhibitor for acid  suppression.  We will also given him intravenous fluids at 100 mL/hour and  follow his electrolytes.  1.  History of iron-deficiency anemia.  The patient is not anemic on this admission.  We will continue to monitor.  1.  History of childhood asthma.  Patient's lungs are clear to auscultation.       CR/MEDQ  D:  04/28/2004  T:  04/28/2004  Job:  [id number]   cc:   Griffith Citron, M.D.  [facility]  [place] [address]  Fax: [phone]   Marlyne Beards, M.D.  [facility]

## 2010-11-20 NOTE — H&P (Signed)
NAME:  Roy Lopez, Roy Lopez                  ACCOUNT NO.:  [id number]   MEDICAL RECORD NO.:  [id number]          PATIENT TYPE:  INP   LOCATION:  1824                         FACILITY:  [facility]   PHYSICIAN:  Sharlet Salina T. Hoxworth, M.D.DATE OF BIRTH:  June 27, 1981   DATE OF ADMISSION:  06/13/2004  DATE OF DISCHARGE:                                HISTORY & PHYSICAL   CHIEF COMPLAINT:  Abdominal pain and distention.   HISTORY OF PRESENT ILLNESS:  The patient is a very pleasant 30 year old male  who has been followed by Griffith Citron, M.D. since about 1997 with  diagnosis of Crohn's disease.  This was managed very well medically, but he  has had approximately two years of gradually worsening symptoms of  intermittent, crampy abdominal pain, distention, and occasional nausea and  vomiting.  These symptoms progressed to the point where he required  admission in October of this year with CT scan findings of partial small  bowel obstruction and thickening of the terminal ileum all consistent with  Crohn's disease and obstruction.  At that point, he was treated with IV  steroids and then placed on p.o. prednisone taper as an outpatient and did  have some improvement.  He, however, continued to have significant  intermittent crampy abdominal pain and distention, more than he had  experienced in the months previously.  His symptoms have essentially  gradually worsened over the last couple of months.  He had a severe episode  of pain, distention, and vomiting last week and then this improved a little  bit, but then recurred again last night when he presented to the emergency  room.  He actually has been having daily, relatively normal bowel movements.  He notes his abdomen is definitely distended.  He describes intermittent  crampy mid abdominal pain, worse with eating.  He has occasional vomiting,  but not on a daily basis.  No fever or chills.   PAST MEDICAL HISTORY:  Surgery is significant for left  inguinal hernia  repair and tonsillectomy.  Crohn's disease is really his only medical  problem.  He has had dilatation for cervical dysphagia.   MEDICATIONS:  1.  Pentasa.  2.  Enterocort.  3.  Folic acid.  4.  Zelnorm.  5.  Azathioprine.  6.  Prednisone 20 mg a day.   ALLERGIES:  No known drug allergies.   SOCIAL HISTORY:  He is single with a fiance.  Accompanied by his parents  today.  Does not smoke cigarettes or drink alcohol.  He is employed  refilling medical oxygen tanks.   FAMILY HISTORY:  Parents are alive and well with no history of Crohn's  disease.  There is some history of heart disease and diabetes in  grandparents.   REVIEW OF SYSTEMS:  GENERAL:  Positive for some malaise.  Slight weight  loss.  HEENT:  Occasional difficulty swallowing.  Otherwise negative.  RESPIRATORY:  He had a cough last week, but this resolved with no shortness  of breath or wheezing.  CARDIAC:  No history of palpitations, chest pain.  GASTROINTESTINAL:  As above.  GENITOURINARY:  No urinary difficulty.  MUSCULOSKELETAL:  Denies joint pain, swelling.   PHYSICAL EXAMINATION:  VITAL SIGNS:  Temperature 97.9, heart rate 60, blood  pressure 113/62, respirations 12.  GENERAL:  Thin, but not emaciated, white male in no acute distress.  SKIN:  Warm and dry.  No rash or infections.  LYMPH NODES:  No cervical, supraclavicular, axillary, or inguinal nodes  palpable.  HEENT:  No mass or thyromegaly.  Sclerae nonicteric.  LUNGS:  Clear to auscultation without increased work of breathing or  wheezing.  HEART:  Regular rate and rhythm with no murmurs, no edema, and no JVD.  ABDOMEN:  Moderately distended.  Positive bowel sounds.  There was mild  diffuse tenderness without guarding.  No appreciable masses or  hepatosplenomegaly.  Well-healed left inguinal hernia.  Incision with no  palpable hernias.  EXTREMITIES:  No joint swelling or deformity.  NEUROLOGY:  Alert and oriented.  Gait normal.  Motor  and sensory grossly  normal.   LABORATORY DATA:  White count elevated at 20,000, hemoglobin 13.7,  urinalysis negative.  Electrolytes and LFT's normal with albumin 3.8.   CT scan of the head and pelvis reviewed.  This shows high grade partial  small bowel obstruction with thickening of the terminal ileum.   ASSESSMENT:  Progressive small bowel obstruction in this patient with  longstanding Crohn's.  I believe he has received maximum medical management  and has progression to near complete obstruction.  He will require  exploration and resection.  This was discussed with Dr. Kinnie Scales, who concurs.  He will be admitted, treated with IV fluids, NG suction, and plan early  surgery.  Will convert to IV steroid coverage.      Benj   BTH/MEDQ  D:  06/13/2004  T:  06/13/2004  Job:  [id number]

## 2010-11-20 NOTE — Op Note (Signed)
Roy Lopez, Roy Lopez                  ACCOUNT NO.:  [id number]   MEDICAL RECORD NO.:  [id number]          PATIENT TYPE:  INP   LOCATION:  5742                         FACILITY:  [facility]   PHYSICIAN:  Sharlet Salina T. Hoxworth, M.D.DATE OF BIRTH:  [birth year]   DATE OF PROCEDURE:  06/15/2004  DATE OF DISCHARGE:                                 OPERATIVE REPORT   PREOPERATIVE DIAGNOSIS:  Crohn's disease with small bowel obstruction.   POSTOPERATIVE DIAGNOSIS:  Crohn's disease with small bowel obstruction with  multiple areas of disease and strictures.   OPERATION PERFORMED:  Multiple small bowel resections (three) and  stricturoplasties (four).   SURGEON:  Lorne Skeens. Hoxworth, M.D.   ANESTHESIA:  General.   INDICATIONS FOR PROCEDURE:  Roy Lopez is a 30 year old white male with  history of Crohn's disease treated medically since approximately 1996 or  1997.  He has a several month history of progressive bowel obstruction,  requiring hospitalization in October of this year and then again this  weekend with acute small bowel obstruction.  He has been through full  medical management without improvement and laparotomy with resection has  been recommended and accepted.  The nature of the procedure, its indications  and risks of bleeding, infection, anastomotic leak were discusses and  understood preoperatively.  The patient is now brought to the operating room  for this procedure.   DESCRIPTION OF PROCEDURE:  The patient was brought to the operating room and  placed in supine position on the operating table and general endotracheal  anesthesia was induced.  The patient received broad spectrum preoperative  antibiotics.  PAS were in place.  The abdomen was widely sterilely prepped  and draped.  The abdomen was explored through a low midline incision just  skirting the umbilicus.  Dissection was carried down through subcutaneous  tissue to the midline fascia using cautery and the peritoneum  entered under  direct vision.  A thorough exploration was then performed.  Liver,  gallbladder, stomach, duodenum, pancreas, retroperitoneum were all  unremarkable.  The small bowel was carefully examined, beginning at the  ligament of Treitz.  The jejunum all appeared entirely normal, mildly  dilated.  At the mid to distal small bowel at about the beginning of the  ileum there were multiple areas of active Crohn's disease and also multiple  very discrete short strictures.  Beginning at the proximal was a short  stricture followed about 10 to 15 cm distal to this by 25 cm of active and  fibrotic Crohn's disease with severe stenosis over this length and this  appeared to be the major area of obstruction of the bowel just proximal to  this quite dilated.  Beyond this, another 10 or 15 cm was another short  tight stricture.  Beyond this about the mid ileum was a shorter area of  active and fibrotic Crohn's disease spanning about 10 cm in length.  Just  beyond this was another short stricture and then as we approached the  terminal ileum there was another short stricture and then about an 8 cm area  of  active stenotic Crohn's disease in the terminal ileum but about 10 to 12  cm of normal terminal ileum up to the ileocecal valve.  I elected to resect  the three areas of long segment stenotic disease and performed  stricturoplasties of the four short strictures.  The first stricture which  was a little bit tighter and longer in length was handled by performing  enterotomy at the stricture site and then using an Endo GIA 75 mm stapler  through this to create a side-to-side anastomosis bypassing the stricture.  The common enterotomy was then closed with a running 3-0 chromic and  interrupted 4-0 silk seromuscular sutures.  Following this, the long area of  resection was performed about 25 cm in the proximal  ileum, completely  taking the mesentery of this section between clamps and tying with two  silk  ties.  Large vessels were suture ligated.  A functional end-to-end  anastomosis was then created with the GIA 75 mm stapler and the common  enterotomy was closed and the specimen removed with a single firing of the  TA-60 stapler.  Mesenteric defect was closed with interrupted 3-0 silks.  Beyond this, the stricturoplasty was performed opening through the stricture  longitudinally along the antimesenteric border and then the closing the  enterotomy transversely with running 3-0 chromic and interrupted 4-0  subseromuscular sutures.  This provided a nice broad lumen of this area.  Beyond this in the mid to distal ileum, a second resection was performed of  about a 10 cm segment in an identical fashion as described for the resection  above.  Just distal to this was another moderate stricture handled with  longitudinal enterotomy and transverse closure as described above.  Working  down toward the terminal ileum was another stricture handled identically as  described above and then finally, about an 8 cm segment of stenotic Crohn's  disease in the terminal ileum was resected about 10 to 15 cm from the  ileocecal valve with the primary anastomosis identically as described above.  Following this, the bowel was again run from the ligament of Treitz to  ileocecal valve and there was no further area of significant active Crohn's  disease or stenosis throughout the small bowel.  Again the three resections  totaled 42 cm.  The abdomen was then copiously irrigated with warm saline  and complete hemostasis assured.  Following this, Tisseel tissue sealant was  used to coat all staple and suture lines.  The viscera were returned to  their anatomic position.  The midline fascia closed with running #1 PDS  beginning at either end of the incision and tied centrally.  Subcutaneous  tissue was irrigated with antibiotic solution.  The skin was closed with staples.  Sponge, needle and instrument counts were  performed.  The patient  was taken to the recovery room in good condition.      Benj   BTH/MEDQ  D:  06/15/2004  T:  06/15/2004  Job:  [id number]

## 2010-11-20 NOTE — Discharge Summary (Signed)
NAME:  Roy Lopez, Roy Lopez                  ACCOUNT NO.:  [id number]   MEDICAL RECORD NO.:  [id number]          PATIENT TYPE:  INP   LOCATION:  5018                         FACILITY:  [facility]   PHYSICIAN:  Roy Lopez, Roy LopezDATE OF BIRTH:  03-14-1981   DATE OF ADMISSION:  04/27/2004  DATE OF DISCHARGE:  05/02/2004                                 DISCHARGE SUMMARY   DIAGNOSES:  1.  Flare up of Crohns disease.  2.  Small bowel obstruction.  3.  Mild anemia of chronic disease.  4.  Hypoalbuminemia.  5.  Hyponatremia.  6.  Mild upper gastrointestinal bleeding from gastritis.   CONSULTATIONS:  Roy Lopez, M.D.   OPERATION:  None.   ALLERGIES:  None.   CODE STATUS:  Hold.   HISTORY OF PRESENT ILLNESS:  This 30 year old white male has a five year  history of Crohns disease and a two year history of increasing abdominal  cramps, fever, nausea.  He had not had a bowel movement for 48 hours or  passed any flatus.  He had not vomited.   He was first diagnosed in 1997 with Crohns disease and had been treated with  Entocort, Zelnorm, Azathioprine, folic acid and Pentasa.   PHYSICAL EXAMINATION:  VITAL SIGNS:  Temperature 100.7, pulse 93, blood  pressure 115/71.  GENERAL APPEARANCE:  He was a thin, white male, normal heart and lungs.  ABDOMEN:  Slightly distended, minimally tender around the umbilical area.  RECTAL:  Stool was heme positive.   LABORATORY DATA:  Initial white count was 8.5, hemoglobin 14.5.  CT scan of  the abdomen showed distal small bowel obstruction with thickening of small  bowel loops consistent with active Crohns disease.   HOSPITAL COURSE:  The patient was put on IV Cipro and Flagyl and Solu-Medrol  80 mg IV q.8h. and PPI with IV fluids.  Over the course of the next 48  hours, he began to feel better.  The pain subsided.  He developed some mild  hyponatremia which resolved quickly.  On October 26, he developed some  coffee-ground secretions, but started  passing gas and had a bowel movement.  He was felt to have some mild upper GI bleeding, either irritation from the  NG tube or some early gastritis.  By October 27, the patient could do  without the NG tube.  Hemoglobin had gone down to 11.5 and abdominal x-ray  showed the small bowel had cleared up.  By October 28, he was able to start  tolerating clear fluids and had had no further GI bleeding.   DISPOSITION:  The patient will be discharged on prednisone 60 mg daily and  her Omeprazole 20 mg b.i.d.  He will stay on clear or full liquids until he  is seen by Dr. Kinnie Scales on October 31 at 4:30 p.m.  At that time, Dr. Kinnie Scales  may elect to resume his previous oral medications.  I would also suggest  that the patient stay on vitamins and try to increase the amount of protein  in his diet since his albumin was only 2.6  on admission.  Laboratory work,  besides the albumin of 2.6, patient had a normal B12 and folate.  Hemoglobin  had gone down to 11.5 by discharge.   Total time for discharge, including discussion with the patient, patient's  girlfriend and patient's father, 40 minutes.       JLB/MEDQ  D:  05/02/2004  T:  05/02/2004  Job:  [id number]   cc:   Roy Lopez, M.D.  [facility] [place]  [place] [address]  Fax: [phone]

## 2010-11-20 NOTE — Discharge Summary (Signed)
Roy Lopez, Roy Lopez                  ACCOUNT NO.:  [id number]   MEDICAL RECORD NO.:  [id number]          PATIENT TYPE:  INP   LOCATION:  5742                         FACILITY:  [facility]   PHYSICIAN:  Sharlet Salina T. Hoxworth, M.D.DATE OF BIRTH:  1981/06/23   DATE OF ADMISSION:  06/13/2004  DATE OF DISCHARGE:  06/22/2004                                 DISCHARGE SUMMARY   DISCHARGE DIAGNOSIS:  Crohn's disease with obstruction.   OPERATION/PROCEDURE:  Multiple small bowel resections (3) and  stricturoplasties (4) on June 15, 2004.   HISTORY OF PRESENT ILLNESS:  This patient is a 30 year old male followed by  Dr. Ritta Slot since 1997 with a diagnosis of Crohn's disease.  He has done  very well medically until recently.  He has had intermittent symptoms for  about two years of gradually worsening crampy abdominal pain, distention,  and occasionally nausea and vomiting.  These symptoms worsened to the point  where he required admission in October 2005 with CT finding of partial small  bowel obstruction and thickening of the terminal ileum, all consistent with  Crohn's disease and obstruction.  He was treated with IV steroids and placed  on p.o. prednisone taper and had some improvement, but continued to have  intermittent crampy abdominal pain and distention.  More recently, his  symptoms have again worsened and last week had a severe episode of crampy  pain, vomiting, and distention that improved slightly, but then recurred  again the night prior to this admission when he presented to the emergency  room.  He states he has been having relatively normal bowel movements.  He  notes his abdomen is definitely distended.  He describes crampy and sharp,  mid abdominal pain worse with eating and occasional vomiting, but not on a  daily basis. No fever or chills.   PAST MEDICAL HISTORY:  Surgery significant only for left inguinal hernia  repair and tonsillectomy.  Crohn's disease is the only  medical problem.  He  has had a dilatation for cervical dysphasia.   MEDICATIONS ON ADMISSION:  1.  Pentasa 250 mg 16 tablets day taken twice daily.  2.  Entocort 9 mg p.o. daily.  3.  Folic acid daily.  4.  Zelnorm 6 mg daily.  5.  Azathioprine 1000 mg daily.  6.  Prednisone 20 mg daily.  7.  Phenergan p.r.n.  8.  Levbid 0.37 mg b.i.d. p.r.n.  9.  NuLev 0.125 1-2 sublingual q.4h p.r.n.   No known drug allergies.   SOCIAL HISTORY, FAMILY HISTORY, REVIEW OF SYSTEMS:  See detailed H&P.Marland Kitchen   PERTINENT PHYSICAL EXAMINATION:  GENERAL:  Thin, but not emaciated white  male in no acute distress.  VITAL SIGNS:  He is afebrile.  Vital signs within normal limits.  ABDOMEN:  Revealed moderate distention, mild diffuse tenderness without  guarding.  No appreciable masses or hepatosplenomegaly.   LABORATORY DATA:  White count elevated 20,000, hemoglobin 13.7.  Electrolytes and LFTs normal.   CT scan of the abdomen and pelvis shows high grade partial small bowel  obstruction with marked thickening of the terminal  ileum.   HOSPITAL COURSE:  The patient was admitted and treated with NG suction and  IV fluids with improvement of his symptoms.  The patient was discussed with  Dr. Kinnie Scales and it was agreed that he had reached maximum benefit from  medical management and had progressed to nearly complete obstruction and  that surgery was required.  He was taken to the operating room on December  12 with findings of multiple skip areas of disease from the jejunum to the  terminal ileum.  He underwent three limited resections totalling about 40 cm  and four stricturoplasties.  He tolerated this procedure well.  The first  postoperative day his Foley was discontinued and he was up out of bed and  ambulatory.  His postoperative course was quite smooth.  White count was  15,000 on the second postop day with normal hemoglobin.  Abdomen remained  quiet and NG was left in place.  NG tube was discontinued on  the third  postoperative day and he remained on ice chips.  He was begun on clear  liquids on the fourth postoperative day.  He had some mild nausea and  cramping, but was passing flatus on the sixth postop day and diet was  advanced to regular.  He tolerated this well and began having bowel  movements.  Abdomen was nondistended.  Wound healed primarily and staples  are removed.  He was ready for discharge on June 22, 2004.  At the time  of discharge, medications are the same as admission minus the Entocort and  plan will be to taper his steroids as an outpatient.  Followup is to be with  Dr. Kinnie Scales within the next two weeks and to my office in 10-14 days.  Final  pathology revealed chronic active inflammatory bowel disease consistent with  Crohn's.      BTH/MEDQ  D:  08/17/2004  T:  08/17/2004  Job:  [id number]   cc:   Griffith Citron, M.D.  [facility] [place]  [place] [address]  Fax: [phone]

## 2012-03-27 ENCOUNTER — Other Ambulatory Visit: Payer: Self-pay | Admitting: Orthopedic Surgery

## 2012-03-27 DIAGNOSIS — M545 Low back pain, unspecified: Secondary | ICD-10-CM

## 2012-03-30 ENCOUNTER — Other Ambulatory Visit: Payer: BC Managed Care – PPO

## 2012-04-07 ENCOUNTER — Ambulatory Visit
Admission: RE | Admit: 2012-04-07 | Discharge: 2012-04-07 | Disposition: A | Discharge status: Home / Self Care | Payer: BC Managed Care – PPO | Source: Ambulatory Visit | Attending: Orthopedic Surgery | Admitting: Orthopedic Surgery

## 2012-04-07 DIAGNOSIS — M545 Low back pain, unspecified: Secondary | ICD-10-CM

## 2012-05-30 ENCOUNTER — Ambulatory Visit: Payer: BC Managed Care – PPO

## 2012-09-18 ENCOUNTER — Ambulatory Visit: Payer: BC Managed Care – PPO | Attending: Orthopedic Surgery | Admitting: Physical Therapy

## 2012-09-18 DIAGNOSIS — IMO0001 Reserved for inherently not codable concepts without codable children: Secondary | ICD-10-CM | POA: Insufficient documentation

## 2012-09-18 DIAGNOSIS — M545 Low back pain, unspecified: Secondary | ICD-10-CM | POA: Insufficient documentation

## 2012-10-02 ENCOUNTER — Ambulatory Visit: Payer: BC Managed Care – PPO | Admitting: Physical Therapy

## 2012-10-04 ENCOUNTER — Ambulatory Visit: Payer: BC Managed Care – PPO | Admitting: Rehabilitation

## 2012-10-10 ENCOUNTER — Ambulatory Visit: Payer: BC Managed Care – PPO | Attending: Orthopedic Surgery | Admitting: Physical Therapy

## 2012-10-10 DIAGNOSIS — M545 Low back pain, unspecified: Secondary | ICD-10-CM | POA: Insufficient documentation

## 2012-10-10 DIAGNOSIS — IMO0001 Reserved for inherently not codable concepts without codable children: Secondary | ICD-10-CM | POA: Insufficient documentation

## 2012-10-17 ENCOUNTER — Ambulatory Visit: Payer: BC Managed Care – PPO | Admitting: Physical Therapy

## 2012-10-23 ENCOUNTER — Ambulatory Visit: Payer: BC Managed Care – PPO | Admitting: Physical Therapy

## 2012-10-25 ENCOUNTER — Ambulatory Visit: Payer: BC Managed Care – PPO | Admitting: Physical Therapy

## 2012-10-26 ENCOUNTER — Ambulatory Visit: Payer: BC Managed Care – PPO | Admitting: Physical Therapy

## 2012-10-31 ENCOUNTER — Ambulatory Visit: Payer: BC Managed Care – PPO | Admitting: Rehabilitation

## 2012-11-01 ENCOUNTER — Ambulatory Visit: Payer: BC Managed Care – PPO | Admitting: Rehabilitation

## 2012-11-07 ENCOUNTER — Ambulatory Visit: Payer: BC Managed Care – PPO | Attending: Orthopedic Surgery | Admitting: Physical Therapy

## 2012-11-07 DIAGNOSIS — IMO0001 Reserved for inherently not codable concepts without codable children: Secondary | ICD-10-CM | POA: Insufficient documentation

## 2012-11-07 DIAGNOSIS — M545 Low back pain, unspecified: Secondary | ICD-10-CM | POA: Insufficient documentation

## 2012-11-09 ENCOUNTER — Ambulatory Visit: Payer: BC Managed Care – PPO | Admitting: Physical Therapy

## 2012-11-21 ENCOUNTER — Ambulatory Visit: Payer: BC Managed Care – PPO | Admitting: Physical Therapy

## 2012-11-23 ENCOUNTER — Ambulatory Visit: Payer: BC Managed Care – PPO | Admitting: Physical Therapy

## 2012-11-29 ENCOUNTER — Ambulatory Visit: Payer: BC Managed Care – PPO | Admitting: Physical Therapy

## 2012-12-12 ENCOUNTER — Ambulatory Visit: Payer: BC Managed Care – PPO | Attending: Orthopedic Surgery | Admitting: Physical Therapy

## 2012-12-12 DIAGNOSIS — M545 Low back pain, unspecified: Secondary | ICD-10-CM | POA: Insufficient documentation

## 2012-12-12 DIAGNOSIS — IMO0001 Reserved for inherently not codable concepts without codable children: Secondary | ICD-10-CM | POA: Insufficient documentation

## 2012-12-21 ENCOUNTER — Ambulatory Visit: Payer: BC Managed Care – PPO | Admitting: Physical Therapy

## 2012-12-27 ENCOUNTER — Ambulatory Visit: Payer: BC Managed Care – PPO | Admitting: Physical Therapy

## 2013-01-17 ENCOUNTER — Ambulatory Visit: Payer: BC Managed Care – PPO | Attending: Orthopedic Surgery | Admitting: Physical Therapy

## 2013-01-17 DIAGNOSIS — IMO0001 Reserved for inherently not codable concepts without codable children: Secondary | ICD-10-CM | POA: Insufficient documentation

## 2013-01-17 DIAGNOSIS — M545 Low back pain, unspecified: Secondary | ICD-10-CM | POA: Insufficient documentation

## 2013-02-21 ENCOUNTER — Ambulatory Visit: Payer: BC Managed Care – PPO | Attending: Orthopedic Surgery | Admitting: Physical Therapy

## 2013-02-21 DIAGNOSIS — IMO0001 Reserved for inherently not codable concepts without codable children: Secondary | ICD-10-CM | POA: Insufficient documentation

## 2013-02-21 DIAGNOSIS — M545 Low back pain, unspecified: Secondary | ICD-10-CM | POA: Insufficient documentation

## 2015-01-17 ENCOUNTER — Emergency Department (HOSPITAL_BASED_OUTPATIENT_CLINIC_OR_DEPARTMENT_OTHER)
Admission: EM | Admit: 2015-01-17 | Discharge: 2015-01-18 | Disposition: A | Discharge status: Other Acute Inpatient Hospital | Payer: BLUE CROSS/BLUE SHIELD | Attending: Emergency Medicine | Admitting: Emergency Medicine

## 2015-01-17 ENCOUNTER — Encounter (HOSPITAL_BASED_OUTPATIENT_CLINIC_OR_DEPARTMENT_OTHER): Payer: Self-pay | Admitting: *Deleted

## 2015-01-17 ENCOUNTER — Emergency Department (HOSPITAL_BASED_OUTPATIENT_CLINIC_OR_DEPARTMENT_OTHER): Payer: BLUE CROSS/BLUE SHIELD

## 2015-01-17 DIAGNOSIS — Z79899 Other long term (current) drug therapy: Secondary | ICD-10-CM | POA: Insufficient documentation

## 2015-01-17 DIAGNOSIS — Z720 Tobacco use: Secondary | ICD-10-CM | POA: Insufficient documentation

## 2015-01-17 DIAGNOSIS — K5090 Crohn's disease, unspecified, without complications: Secondary | ICD-10-CM | POA: Diagnosis not present

## 2015-01-17 DIAGNOSIS — K5669 Other intestinal obstruction: Secondary | ICD-10-CM | POA: Diagnosis not present

## 2015-01-17 DIAGNOSIS — Z8719 Personal history of other diseases of the digestive system: Secondary | ICD-10-CM

## 2015-01-17 DIAGNOSIS — K56609 Unspecified intestinal obstruction, unspecified as to partial versus complete obstruction: Secondary | ICD-10-CM

## 2015-01-17 DIAGNOSIS — R109 Unspecified abdominal pain: Secondary | ICD-10-CM

## 2015-01-17 HISTORY — DX: Crohn's disease, unspecified, without complications: K50.90

## 2015-01-17 LAB — CBC WITH DIFFERENTIAL/PLATELET
BASOS ABS: 0 10*3/uL (ref 0.0–0.1)
BASOS PCT: 0 % (ref 0–1)
EOS ABS: 0.6 10*3/uL (ref 0.0–0.7)
Eosinophils Relative: 5 % (ref 0–5)
HEMATOCRIT: 43 % (ref 39.0–52.0)
Hemoglobin: 14.6 g/dL (ref 13.0–17.0)
LYMPHS ABS: 2.7 10*3/uL (ref 0.7–4.0)
Lymphocytes Relative: 25 % (ref 12–46)
MCH: 29.9 pg (ref 26.0–34.0)
MCHC: 34 g/dL (ref 30.0–36.0)
MCV: 88.1 fL (ref 78.0–100.0)
MONO ABS: 0.8 10*3/uL (ref 0.1–1.0)
Monocytes Relative: 8 % (ref 3–12)
NEUTROS ABS: 6.9 10*3/uL (ref 1.7–7.7)
NEUTROS PCT: 62 % (ref 43–77)
Platelets: 300 10*3/uL (ref 150–400)
RBC: 4.88 MIL/uL (ref 4.22–5.81)
RDW: 12.7 % (ref 11.5–15.5)
WBC: 11 10*3/uL — ABNORMAL HIGH (ref 4.0–10.5)

## 2015-01-17 LAB — URINALYSIS, ROUTINE W REFLEX MICROSCOPIC
Bilirubin Urine: NEGATIVE
Glucose, UA: NEGATIVE mg/dL
Hgb urine dipstick: NEGATIVE
Ketones, ur: NEGATIVE mg/dL
LEUKOCYTES UA: NEGATIVE
NITRITE: NEGATIVE
PH: 6.5 (ref 5.0–8.0)
Protein, ur: NEGATIVE mg/dL
SPECIFIC GRAVITY, URINE: 1.012 (ref 1.005–1.030)
UROBILINOGEN UA: 1 mg/dL (ref 0.0–1.0)

## 2015-01-17 LAB — COMPREHENSIVE METABOLIC PANEL
ALT: 19 U/L (ref 17–63)
ANION GAP: 6 (ref 5–15)
AST: 22 U/L (ref 15–41)
Albumin: 4.3 g/dL (ref 3.5–5.0)
Alkaline Phosphatase: 58 U/L (ref 38–126)
BUN: 12 mg/dL (ref 6–20)
CALCIUM: 9.4 mg/dL (ref 8.9–10.3)
CO2: 30 mmol/L (ref 22–32)
Chloride: 106 mmol/L (ref 101–111)
Creatinine, Ser: 0.94 mg/dL (ref 0.61–1.24)
GFR calc non Af Amer: >60 mL/min (ref >60)
GLUCOSE: 93 mg/dL (ref 65–99)
POTASSIUM: 3.7 mmol/L (ref 3.5–5.1)
Sodium: 142 mmol/L (ref 135–145)
Total Bilirubin: 1.3 mg/dL — ABNORMAL HIGH (ref 0.3–1.2)
Total Protein: 7.4 g/dL (ref 6.5–8.1)

## 2015-01-17 LAB — LIPASE, BLOOD: LIPASE: 10 U/L — AB (ref 22–51)

## 2015-01-17 MED ORDER — HYDROMORPHONE HCL 1 MG/ML IJ SOLN
1.0000 mg | Freq: Once | INTRAMUSCULAR | Status: AC
  Administered 2015-01-17: 1 mg via INTRAVENOUS
  Filled 2015-01-17: qty 1

## 2015-01-17 MED ORDER — MORPHINE SULFATE 4 MG/ML IJ SOLN
4.0000 mg | Freq: Once | INTRAMUSCULAR | Status: AC
  Administered 2015-01-17: 4 mg via INTRAVENOUS
  Filled 2015-01-17: qty 1

## 2015-01-17 MED ORDER — ONDANSETRON HCL 4 MG/2ML IJ SOLN
4.0000 mg | Freq: Once | INTRAMUSCULAR | Status: AC
  Administered 2015-01-17: 4 mg via INTRAVENOUS
  Filled 2015-01-17: qty 2

## 2015-01-17 NOTE — ED Notes (Signed)
Abdominal pain x 3 days

## 2015-01-17 NOTE — ED Provider Notes (Signed)
CSN: [id number]     Arrival date & time 01/17/15  2106 History  This chart was scribed for Geoffery Lyons, MD by Ronney Lion, ED Scribe. This patient was seen in room MH01/MH01 and the patient's care was started at 10:17 PM.   Chief Complaint  Patient presents with  . Abdominal Pain   Patient is a 34 y.o. male presenting with abdominal pain. The history is provided by the patient. No language interpreter was used.  Abdominal Pain Pain radiates to:  Does not radiate Pain severity:  Severe Onset quality:  Gradual Duration:  3 days Timing:  Constant Progression:  Waxing and waning Chronicity:  New Context: previous surgery   Relieved by:  None tried Worsened by:  Nothing tried Ineffective treatments:  None tried Associated symptoms: no vomiting     HPI Comments: Roy Lopez is a 34 y.o. male with a PMHx of Chrohn's diagnosed about 20 years ago, who presents to the Emergency Department complaining of severe abdominal pain that began 3 days ago. He states he recently was started on Humira for Chrohn's. Exertion, deep inspiration, and heat exacerbate the pain. He reports 2 BMs yesterday. Patient reports a history of 2 x bowel resection surgeries in 2006 and 2012. Dr. Chesley Mires at [facility] [place] follows this. He denies vomiting.  Patient notes a history of SIBO several years ago, which required hospitalization, although he adds the pain today is not as severe.  Past Medical History  Diagnosis Date  . Crohn's disease    Past Surgical History  Procedure Laterality Date  . Abdominal surgery     No family history on file. History  Substance Use Topics  . Smoking status: Current Every Day Smoker -- 0.50 packs/day    Types: Cigarettes  . Smokeless tobacco: Not on file  . Alcohol Use: Yes    Review of Systems  Gastrointestinal: Positive for abdominal pain. Negative for vomiting.  All other systems reviewed and are negative.     Allergies  Ciprofloxacin  Home Medications    Prior to Admission medications   Medication Sig Start Date End Date Taking? Authorizing Provider  Adalimumab (HUMIRA [place]) Inject into the skin.   Yes Historical Provider, MD  ALBUTEROL IN Inhale into the lungs.   Yes Historical Provider, MD  dicyclomine (BENTYL) 10 MG capsule Take 10 mg by mouth 4 (four) times daily -  before meals and at bedtime.   Yes Historical Provider, MD  RABEprazole (ACIPHEX) 20 MG tablet Take 20 mg by mouth daily.   Yes Historical Provider, MD   BP 121/85 mmHg  Pulse 70  Temp(Src) 98 F (36.7 C) (Oral)  Resp 18  Ht  (1.778 m)  Wt 155 lb (70.308 kg)  BMI 22.24 kg/m2  SpO2 100% Physical Exam  Constitutional: He is oriented to person, place, and time. He appears well-developed and well-nourished. No distress.  HENT:  Head: Normocephalic and atraumatic.  Right Ear: Hearing normal.  Left Ear: Hearing normal.  Nose: Nose normal.  Mouth/Throat: Oropharynx is clear and moist and mucous membranes are normal.  Eyes: Conjunctivae and EOM are normal. Pupils are equal, round, and reactive to light.  Neck: Normal range of motion. Neck supple.  Cardiovascular: Regular rhythm, S1 normal and S2 normal.  Exam reveals no gallop and no friction rub.   No murmur heard. Pulmonary/Chest: Effort normal and breath sounds normal. No respiratory distress. He exhibits no tenderness.  Abdominal: Soft. Normal appearance and bowel sounds are normal. There is  no hepatosplenomegaly. There is tenderness. There is no rebound, no guarding, no tenderness at McBurney's point and negative Murphy's sign. No hernia.  TTP in epigastrium, no rebound or guarding.   Musculoskeletal: Normal range of motion.  Neurological: He is alert and oriented to person, place, and time. He has normal strength. No cranial nerve deficit or sensory deficit. Coordination normal. GCS eye subscore is 4. GCS verbal subscore is 5. GCS motor subscore is 6.  Skin: Skin is warm, dry and intact. No rash noted. No  cyanosis.  Psychiatric: He has a normal mood and affect. His speech is normal and behavior is normal. Thought content normal.  Nursing note and vitals reviewed.   ED Course  Procedures (including critical care time)  DIAGNOSTIC STUDIES: Oxygen Saturation is 100% on RA, normal by my interpretation.    COORDINATION OF CARE: 10:26 PM - Discussed XR results and  treatment plan with pt at bedside which includes nasogastric decompression. Will also give pain and nausea medication. Plan to transfer pt to [facility] for admission, as pt has established GI care there. Pt verbalized understanding and agreed to plan.   Labs Review Labs Reviewed  LIPASE, BLOOD - Abnormal; Notable for the following:    Lipase 10 (*)    All other components within normal limits  COMPREHENSIVE METABOLIC PANEL - Abnormal; Notable for the following:    Total Bilirubin 1.3 (*)    All other components within normal limits  CBC WITH DIFFERENTIAL/PLATELET - Abnormal; Notable for the following:    WBC 11.0 (*)    All other components within normal limits  URINALYSIS, ROUTINE W REFLEX MICROSCOPIC (NOT AT [facility])    Imaging Review Dg Abd Acute W/chest  01/17/2015   CLINICAL DATA:  Left mid and lower abdominal pain for 3 days  EXAM: DG ABDOMEN ACUTE W/ 1V CHEST  COMPARISON:  08/23/2010  FINDINGS: Cardiomediastinal silhouette is unremarkable. No acute infiltrate or pleural effusion. No pulmonary edema. Distended small bowel loops with air-fluid level in left mid and left upper abdomen highly suspicious for bowel obstruction. No free abdominal air.  IMPRESSION: No acute disease within chest. Distended small bowel loops with air and air-fluid level in left abdomen and left mid abdomen highly suspicious for small bowel obstruction. No free abdominal air.   Electronically Signed   By: Natasha Mead M.D.   On: 01/17/2015 22:08    MDM   Final diagnoses:  Abdominal pain    Patient with history of Crohn's disease and prior bowel  resections. He presents for evaluation of abdominal pain. His laboratory studies are unremarkable, however acute abdominal series reveals a small bowel obstruction. As the patient obtains the majority of his care at [facility], hives spoken with Dr. Denny Peon who is the surgeon on call there. He agrees to accept the patient in transfer.   I personally performed the services described in this documentation, which was scribed in my presence. The recorded information has been reviewed and is accurate.      Geoffery Lyons, MD 01/17/15 2241

## 2015-11-19 ENCOUNTER — Emergency Department (HOSPITAL_BASED_OUTPATIENT_CLINIC_OR_DEPARTMENT_OTHER): Payer: BLUE CROSS/BLUE SHIELD

## 2015-11-19 ENCOUNTER — Encounter (HOSPITAL_BASED_OUTPATIENT_CLINIC_OR_DEPARTMENT_OTHER): Payer: Self-pay | Admitting: *Deleted

## 2015-11-19 ENCOUNTER — Emergency Department (HOSPITAL_BASED_OUTPATIENT_CLINIC_OR_DEPARTMENT_OTHER)
Admission: EM | Admit: 2015-11-19 | Discharge: 2015-11-19 | Disposition: A | Discharge status: Home / Self Care | Payer: BLUE CROSS/BLUE SHIELD | Attending: Emergency Medicine | Admitting: Emergency Medicine

## 2015-11-19 DIAGNOSIS — Z9889 Other specified postprocedural states: Secondary | ICD-10-CM | POA: Insufficient documentation

## 2015-11-19 DIAGNOSIS — J189 Pneumonia, unspecified organism: Secondary | ICD-10-CM

## 2015-11-19 DIAGNOSIS — J181 Lobar pneumonia, unspecified organism: Secondary | ICD-10-CM

## 2015-11-19 DIAGNOSIS — K5900 Constipation, unspecified: Secondary | ICD-10-CM

## 2015-11-19 DIAGNOSIS — F17210 Nicotine dependence, cigarettes, uncomplicated: Secondary | ICD-10-CM | POA: Diagnosis not present

## 2015-11-19 DIAGNOSIS — R1012 Left upper quadrant pain: Secondary | ICD-10-CM | POA: Diagnosis present

## 2015-11-19 LAB — COMPREHENSIVE METABOLIC PANEL
ALT: 11 U/L — AB (ref 17–63)
AST: 16 U/L (ref 15–41)
Albumin: 4.5 g/dL (ref 3.5–5.0)
Alkaline Phosphatase: 67 U/L (ref 38–126)
Anion gap: 6 (ref 5–15)
BILIRUBIN TOTAL: 0.8 mg/dL (ref 0.3–1.2)
BUN: 12 mg/dL (ref 6–20)
CALCIUM: 9.4 mg/dL (ref 8.9–10.3)
CHLORIDE: 102 mmol/L (ref 101–111)
CO2: 31 mmol/L (ref 22–32)
CREATININE: 0.82 mg/dL (ref 0.61–1.24)
GFR calc non Af Amer: >60 mL/min (ref >60)
GLUCOSE: 72 mg/dL (ref 65–99)
Potassium: 3.5 mmol/L (ref 3.5–5.1)
Sodium: 139 mmol/L (ref 135–145)
Total Protein: 7.6 g/dL (ref 6.5–8.1)

## 2015-11-19 LAB — CBC WITH DIFFERENTIAL/PLATELET
Basophils Absolute: 0 10*3/uL (ref 0.0–0.1)
Basophils Relative: 0 %
Eosinophils Absolute: 0.7 10*3/uL (ref 0.0–0.7)
Eosinophils Relative: 6 %
HEMATOCRIT: 41.7 % (ref 39.0–52.0)
Hemoglobin: 14 g/dL (ref 13.0–17.0)
LYMPHS ABS: 3 10*3/uL (ref 0.7–4.0)
LYMPHS PCT: 25 %
MCH: 29.5 pg (ref 26.0–34.0)
MCHC: 33.6 g/dL (ref 30.0–36.0)
MCV: 88 fL (ref 78.0–100.0)
Monocytes Absolute: 1.1 10*3/uL — ABNORMAL HIGH (ref 0.1–1.0)
Monocytes Relative: 9 %
Neutro Abs: 7.3 10*3/uL (ref 1.7–7.7)
Neutrophils Relative %: 60 %
PLATELETS: 363 10*3/uL (ref 150–400)
RBC: 4.74 MIL/uL (ref 4.22–5.81)
RDW: 13.1 % (ref 11.5–15.5)
WBC: 12.1 10*3/uL — AB (ref 4.0–10.5)

## 2015-11-19 MED ORDER — ONDANSETRON HCL 4 MG/2ML IJ SOLN
INTRAMUSCULAR | Status: AC
Start: 2015-11-19 — End: 2015-11-19
  Administered 2015-11-19: 4 mg
  Filled 2015-11-19: qty 2

## 2015-11-19 MED ORDER — AZITHROMYCIN 250 MG PO TABS
500.0000 mg | ORAL_TABLET | Freq: Once | ORAL | Status: AC
  Administered 2015-11-19: 500 mg via ORAL
  Filled 2015-11-19: qty 2

## 2015-11-19 MED ORDER — DICYCLOMINE HCL 20 MG PO TABS: 20.0000 mg | ORAL_TABLET | Freq: Two times a day (BID) | ORAL | Status: DC

## 2015-11-19 MED ORDER — HYDROMORPHONE HCL 1 MG/ML IJ SOLN
INTRAMUSCULAR | Status: AC
  Administered 2015-11-19: 1 mg
  Filled 2015-11-19: qty 1

## 2015-11-19 MED ORDER — GI COCKTAIL ~~LOC~~
ORAL | Status: AC
  Administered 2015-11-19: 30 mL
  Filled 2015-11-19: qty 30

## 2015-11-19 MED ORDER — AZITHROMYCIN 250 MG PO TABS
ORAL_TABLET | ORAL | Status: DC
Start: 2015-11-19 — End: 2016-03-21

## 2015-11-19 NOTE — Discharge Instructions (Signed)
Community-Acquired Pneumonia, Adult °Pneumonia is an infection of the lungs. One type of pneumonia can happen while a person is in a hospital. A different type can happen when a person is not in a hospital (community-acquired pneumonia). It is easy for this kind to spread from person to person. It can spread to you if you breathe near an infected person who coughs or sneezes. Some symptoms include: °· A dry cough. °· A wet (productive) cough. °· Fever. °· Sweating. °· Chest pain. °HOME CARE °· Take over-the-counter and prescription medicines only as told by your doctor. °¨ Only take cough medicine if you are losing sleep. °¨ If you were prescribed an antibiotic medicine, take it as told by your doctor. Do not stop taking the antibiotic even if you start to feel better. °· Sleep with your head and neck raised (elevated). You can do this by putting a few pillows under your head, or you can sleep in a recliner. °· Do not use tobacco products. These include cigarettes, chewing tobacco, and e-cigarettes. If you need help quitting, ask your doctor. °· Drink enough water to keep your pee (urine) clear or pale yellow. °A shot (vaccine) can help prevent pneumonia. Shots are often suggested for: °· People older than 35 years of age. °· People older than 35 years of age: °¨ Who are having cancer treatment. °¨ Who have long-term (chronic) lung disease. °¨ Who have problems with their body's defense system (immune system). °You may also prevent pneumonia if you take these actions: °· Get the flu (influenza) shot every year. °· Go to the dentist as often as told. °· Wash your hands often. If soap and water are not available, use hand sanitizer. °GET HELP IF: °· You have a fever. °· You lose sleep because your cough medicine does not help. °GET HELP RIGHT AWAY IF: °· You are short of breath and it gets worse. °· You have more chest pain. °· Your sickness gets worse. This is very serious if: °¨ You are an older adult. °¨ Your  body's defense system is weak. °· You cough up blood. °  °This information is not intended to replace advice given to you by your health care provider. Make sure you discuss any questions you have with your health care provider. °  °Document Released: 12/08/2007 Document Revised: 03/12/2015 Document Reviewed: 10/16/2014 °Elsevier Interactive Patient Education ©2016 [employer] ° °Constipation, Adult °Constipation is when a person has fewer than three bowel movements a week, has difficulty having a bowel movement, or has stools that are dry, hard, or larger than normal. As people grow older, constipation is more common. A low-fiber diet, not taking in enough fluids, and taking certain medicines may make constipation worse.  °CAUSES  °· Certain medicines, such as antidepressants, pain medicine, iron supplements, antacids, and water pills.   °· Certain diseases, such as diabetes, irritable bowel syndrome (IBS), thyroid disease, or depression.   °· Not drinking enough water.   °· Not eating enough fiber-rich foods.   °· Stress or travel.   °· Lack of physical activity or exercise.   °· Ignoring the urge to have a bowel movement.   °· Using laxatives too much.   °SIGNS AND SYMPTOMS  °· Having fewer than three bowel movements a week.   °· Straining to have a bowel movement.   °· Having stools that are hard, dry, or larger than normal.   °· Feeling full or bloated.   °· Pain in the lower abdomen.   °· Not feeling relief after having a bowel movement.   °  DIAGNOSIS  °Your health care provider will take a medical history and perform a physical exam. Further testing may be done for severe constipation. Some tests may include: °· A barium enema X-ray to examine your rectum, colon, and, sometimes, your small intestine.   °· A sigmoidoscopy to examine your lower colon.   °· A colonoscopy to examine your entire colon. °TREATMENT  °Treatment will depend on the severity of your constipation and what is causing it. Some dietary  treatments include drinking more fluids and eating more fiber-rich foods. Lifestyle treatments may include regular exercise. If these diet and lifestyle recommendations do not help, your health care provider may recommend taking over-the-counter laxative medicines to help you have bowel movements. Prescription medicines may be prescribed if over-the-counter medicines do not work.  °HOME CARE INSTRUCTIONS  °· Eat foods that have a lot of fiber, such as fruits, vegetables, whole grains, and beans. °· Limit foods high in fat and processed sugars, such as french fries, hamburgers, cookies, candies, and soda.   °· A fiber supplement may be added to your diet if you cannot get enough fiber from foods.   °· Drink enough fluids to keep your urine clear or pale yellow.   °· Exercise regularly or as directed by your health care provider.   °· Go to the restroom when you have the urge to go. Do not hold it.   °· Only take over-the-counter or prescription medicines as directed by your health care provider. Do not take other medicines for constipation without talking to your health care provider first.   °SEEK IMMEDIATE MEDICAL CARE IF:  °· You have bright red blood in your stool.   °· Your constipation lasts for more than 4 days or gets worse.   °· You have abdominal or rectal pain.   °· You have thin, pencil-like stools.   °· You have unexplained weight loss. °MAKE SURE YOU:  °· Understand these instructions. °· Will watch your condition. °· Will get help right away if you are not doing well or get worse. °  °This information is not intended to replace advice given to you by your health care provider. Make sure you discuss any questions you have with your health care provider. °  °Document Released: 03/19/2004 Document Revised: 07/12/2014 Document Reviewed: 04/02/2013 °Elsevier Interactive Patient Education ©2016 Elsevier Inc. ° °

## 2015-11-19 NOTE — ED Notes (Signed)
Pt c/o left upper abd pain x 3 days

## 2015-11-19 NOTE — ED Provider Notes (Addendum)
CSN: [id number]     Arrival date & time 11/19/15  0012 History   First MD Initiated Contact with Patient 11/19/15 0020     Chief Complaint  Patient presents with  . Abdominal Pain     (Consider location/radiation/quality/duration/timing/severity/associated sxs/prior Treatment) Patient is a 35 y.o. male presenting with abdominal pain. The history is provided by the patient.  Abdominal Pain Pain location:  LUQ Pain quality: cramping   Pain radiates to:  Does not radiate Pain severity:  Moderate Onset quality:  Gradual Timing:  Constant Progression:  Unchanged Chronicity:  Recurrent Context: not alcohol use   Relieved by:  Nothing Worsened by:  Nothing tried Ineffective treatments:  None tried Associated symptoms: constipation   Associated symptoms: no anorexia, no chest pain, no chills, no cough, no dysuria, no fever, no nausea, no shortness of breath and no vomiting   Risk factors: has not had multiple surgeries     Past Medical History  Diagnosis Date  . Crohn's disease [facility])    Past Surgical History  Procedure Laterality Date  . Abdominal surgery     History reviewed. No pertinent family history. Social History  Substance Use Topics  . Smoking status: Current Every Day Smoker -- 0.50 packs/day    Types: Cigarettes  . Smokeless tobacco: None  . Alcohol Use: Yes    Review of Systems  Constitutional: Negative for fever and chills.  Respiratory: Negative for cough and shortness of breath.   Cardiovascular: Negative for chest pain.  Gastrointestinal: Positive for abdominal pain and constipation. Negative for nausea, vomiting and anorexia.  Genitourinary: Negative for dysuria.  All other systems reviewed and are negative.     Allergies  Ciprofloxacin  Home Medications   Prior to Admission medications   Medication Sig Start Date End Date Taking? Authorizing Provider  Adalimumab (HUMIRA [place]) Inject into the skin.    Historical Provider, MD  ALBUTEROL IN Inhale  into the lungs.    Historical Provider, MD  dicyclomine (BENTYL) 10 MG capsule Take 10 mg by mouth 4 (four) times daily -  before meals and at bedtime.    Historical Provider, MD  RABEprazole (ACIPHEX) 20 MG tablet Take 20 mg by mouth daily.    Historical Provider, MD   BP 122/85 mmHg  Pulse 62  Temp(Src) 98.6 F (37 C) (Oral)  Resp 16  Ht 5\' 10"  (1.778 m)  Wt 150 lb (68.04 kg)  BMI 21.52 kg/m2  SpO2 100% Physical Exam  Constitutional: He is oriented to person, place, and time. He appears well-developed and well-nourished. No distress.  HENT:  Head: Normocephalic and atraumatic.  Mouth/Throat: Oropharynx is clear and moist.  Eyes: Conjunctivae are normal. Pupils are equal, round, and reactive to light.  Neck: Normal range of motion. Neck supple.  Cardiovascular: Normal rate, regular rhythm and intact distal pulses.   Pulmonary/Chest: Effort normal and breath sounds normal. He has no wheezes. He has no rales.  Abdominal: Soft. There is no tenderness. There is no rigidity, no rebound, no guarding, no tenderness at McBurney's point and negative Murphy's sign.  Gassy, constipation  Musculoskeletal: Normal range of motion.  Neurological: He is alert and oriented to person, place, and time.  Skin: Skin is warm and dry.  Psychiatric: He has a normal mood and affect.    ED Course  Procedures (including critical care time) Labs Review Labs Reviewed  CBC WITH DIFFERENTIAL/PLATELET - Abnormal; Notable for the following:    WBC 12.1 (*)    Monocytes Absolute  1.1 (*)    All other components within normal limits  COMPREHENSIVE METABOLIC PANEL - Abnormal; Notable for the following:    ALT 11 (*)    All other components within normal limits  URINALYSIS, ROUTINE W REFLEX MICROSCOPIC (NOT AT [facility])    Imaging Review Dg Abd Acute W/chest  11/19/2015  CLINICAL DATA:  Left abdominal pain for 3 days. Previous small bowel surgeries. History of Crohn disease. EXAM: DG ABDOMEN ACUTE W/ 1V CHEST  COMPARISON:  01/17/2015 FINDINGS: Patchy nodular infiltrates demonstrated in the right lung, new since previous study. This is likely inflammatory, but pulmonary nodules are not excluded and follow-up in 3-4 weeks after treatment of acute process is recommended. Left lung is clear. Normal heart size and pulmonary vascularity. Gas and stool in the colon with gas-filled mid abdominal small bowel. No small or large bowel distention. Postoperative changes in the upper abdomen. No radiopaque stones. No free intra-abdominal air. No abnormal air-fluid levels. Visualized bones appear intact. IMPRESSION: Nonspecific nodular infiltrates in the right mid lung, new since previous study. This may represent inflammatory process but followup in 3-4 weeks after treatment of acute process is recommended. Normal nonobstructive bowel gas pattern. Electronically Signed   By: Burman Nieves M.D.   On: 11/19/2015 03:48   I have personally reviewed and evaluated these images and lab results as part of my medical decision-making.   EKG Interpretation None      MDM   Final diagnoses:  None   Filed Vitals:   11/19/15 0017  BP: 122/85  Pulse: 62  Temp: 98.6 F (37 C)  Resp: 16   Results for orders placed or performed during the hospital encounter of 11/19/15  CBC with Differential/Platelet  Result Value Ref Range   WBC 12.1 (H) 4.0 - 10.5 K/uL   RBC 4.74 4.22 - 5.81 MIL/uL   Hemoglobin 14.0 13.0 - 17.0 g/dL   HCT 40.9 81.1 - 91.4 %   MCV 88.0 78.0 - 100.0 fL   MCH 29.5 26.0 - 34.0 pg   MCHC 33.6 30.0 - 36.0 g/dL   RDW 78.2 95.6 - 21.3 %   Platelets 363 150 - 400 K/uL   Neutrophils Relative % 60 %   Neutro Abs 7.3 1.7 - 7.7 K/uL   Lymphocytes Relative 25 %   Lymphs Abs 3.0 0.7 - 4.0 K/uL   Monocytes Relative 9 %   Monocytes Absolute 1.1 (H) 0.1 - 1.0 K/uL   Eosinophils Relative 6 %   Eosinophils Absolute 0.7 0.0 - 0.7 K/uL   Basophils Relative 0 %   Basophils Absolute 0.0 0.0 - 0.1 K/uL   Comprehensive metabolic panel  Result Value Ref Range   Sodium 139 135 - 145 mmol/L   Potassium 3.5 3.5 - 5.1 mmol/L   Chloride 102 101 - 111 mmol/L   CO2 31 22 - 32 mmol/L   Glucose, Bld 72 65 - 99 mg/dL   BUN 12 6 - 20 mg/dL   Creatinine, Ser 0.86 0.61 - 1.24 mg/dL   Calcium 9.4 8.9 - 57.8 mg/dL   Total Protein 7.6 6.5 - 8.1 g/dL   Albumin 4.5 3.5 - 5.0 g/dL   AST 16 15 - 41 U/L   ALT 11 (L) 17 - 63 U/L   Alkaline Phosphatase 67 38 - 126 U/L   Total Bilirubin 0.8 0.3 - 1.2 mg/dL   GFR calc non Af Amer >60 >60 mL/min   GFR calc Af Amer >60 >60 mL/min   Anion  gap 6 5 - 15   Dg Abd Acute W/chest  11/19/2015  CLINICAL DATA:  Left abdominal pain for 3 days. Previous small bowel surgeries. History of Crohn disease. EXAM: DG ABDOMEN ACUTE W/ 1V CHEST COMPARISON:  01/17/2015 FINDINGS: Patchy nodular infiltrates demonstrated in the right lung, new since previous study. This is likely inflammatory, but pulmonary nodules are not excluded and follow-up in 3-4 weeks after treatment of acute process is recommended. Left lung is clear. Normal heart size and pulmonary vascularity. Gas and stool in the colon with gas-filled mid abdominal small bowel. No small or large bowel distention. Postoperative changes in the upper abdomen. No radiopaque stones. No free intra-abdominal air. No abnormal air-fluid levels. Visualized bones appear intact. IMPRESSION: Nonspecific nodular infiltrates in the right mid lung, new since previous study. This may represent inflammatory process but followup in 3-4 weeks after treatment of acute process is recommended. Normal nonobstructive bowel gas pattern. Electronically Signed   By: Burman Nieves M.D.   On: 11/19/2015 03:48    Medications  ondansetron (ZOFRAN) 4 MG/2ML injection (4 mg  Given 11/19/15 0140)  HYDROmorphone (DILAUDID) 1 MG/ML injection (1 mg  Given 11/19/15 0140)  gi cocktail suspension (30 mLs  Given 11/19/15 0140)    Will treat for infiltrate with  azithromycin and have patient follow up with his PMD in one day and recheck chest xray in 1 week to exclude nodules.  No cough no fever but given crohns will start zpak Strict return precautions for fever, cough SOB, CP or worsening pain.  Continue home oxycodone.  Start miralax QAM.  Patient and family verbalize understanding and agree to follow up    Taijon Vink, MD 11/19/15 0412  Emlyn Maves, MD 11/19/15 1610

## 2016-03-20 DIAGNOSIS — F17210 Nicotine dependence, cigarettes, uncomplicated: Secondary | ICD-10-CM | POA: Insufficient documentation

## 2016-03-20 DIAGNOSIS — K5000 Crohn's disease of small intestine without complications: Secondary | ICD-10-CM | POA: Diagnosis not present

## 2016-03-20 DIAGNOSIS — Z79899 Other long term (current) drug therapy: Secondary | ICD-10-CM | POA: Diagnosis not present

## 2016-03-20 DIAGNOSIS — R1032 Left lower quadrant pain: Secondary | ICD-10-CM | POA: Diagnosis present

## 2016-03-21 ENCOUNTER — Emergency Department (HOSPITAL_BASED_OUTPATIENT_CLINIC_OR_DEPARTMENT_OTHER): Payer: BLUE CROSS/BLUE SHIELD

## 2016-03-21 ENCOUNTER — Encounter (HOSPITAL_BASED_OUTPATIENT_CLINIC_OR_DEPARTMENT_OTHER): Payer: Self-pay | Admitting: *Deleted

## 2016-03-21 ENCOUNTER — Emergency Department (HOSPITAL_BASED_OUTPATIENT_CLINIC_OR_DEPARTMENT_OTHER)
Admission: EM | Admit: 2016-03-21 | Discharge: 2016-03-21 | Discharge status: Against Medical Advice | Payer: BLUE CROSS/BLUE SHIELD | Attending: Emergency Medicine | Admitting: Emergency Medicine

## 2016-03-21 DIAGNOSIS — K5000 Crohn's disease of small intestine without complications: Secondary | ICD-10-CM

## 2016-03-21 LAB — COMPREHENSIVE METABOLIC PANEL
ALT: 11 U/L — ABNORMAL LOW (ref 17–63)
AST: 14 U/L — AB (ref 15–41)
Albumin: 3.8 g/dL (ref 3.5–5.0)
Alkaline Phosphatase: 58 U/L (ref 38–126)
Anion gap: 6 (ref 5–15)
BUN: 13 mg/dL (ref 6–20)
CO2: 30 mmol/L (ref 22–32)
CREATININE: 0.79 mg/dL (ref 0.61–1.24)
Calcium: 9 mg/dL (ref 8.9–10.3)
Chloride: 103 mmol/L (ref 101–111)
GFR calc Af Amer: >60 mL/min (ref >60)
GLUCOSE: 113 mg/dL — AB (ref 65–99)
POTASSIUM: 3.3 mmol/L — AB (ref 3.5–5.1)
Sodium: 139 mmol/L (ref 135–145)
Total Bilirubin: 0.6 mg/dL (ref 0.3–1.2)
Total Protein: 6.7 g/dL (ref 6.5–8.1)

## 2016-03-21 LAB — CBC WITH DIFFERENTIAL/PLATELET
BASOS ABS: 0.1 10*3/uL (ref 0.0–0.1)
BASOS PCT: 0 %
EOS PCT: 5 %
Eosinophils Absolute: 0.6 10*3/uL (ref 0.0–0.7)
HCT: 38.6 % — ABNORMAL LOW (ref 39.0–52.0)
Hemoglobin: 12.9 g/dL — ABNORMAL LOW (ref 13.0–17.0)
LYMPHS PCT: 29 %
Lymphs Abs: 3.8 10*3/uL (ref 0.7–4.0)
MCH: 29.5 pg (ref 26.0–34.0)
MCHC: 33.4 g/dL (ref 30.0–36.0)
MCV: 88.3 fL (ref 78.0–100.0)
Monocytes Absolute: 1 10*3/uL (ref 0.1–1.0)
Monocytes Relative: 7 %
Neutro Abs: 7.6 10*3/uL (ref 1.7–7.7)
Neutrophils Relative %: 59 %
PLATELETS: 332 10*3/uL (ref 150–400)
RBC: 4.37 MIL/uL (ref 4.22–5.81)
RDW: 12.6 % (ref 11.5–15.5)
WBC: 13 10*3/uL — AB (ref 4.0–10.5)

## 2016-03-21 LAB — LIPASE, BLOOD: LIPASE: 22 U/L (ref 11–51)

## 2016-03-21 LAB — I-STAT CG4 LACTIC ACID, ED: LACTIC ACID, VENOUS: 0.58 mmol/L (ref 0.5–1.9)

## 2016-03-21 MED ORDER — HYDROMORPHONE HCL 1 MG/ML IJ SOLN
1.0000 mg | INTRAMUSCULAR | Status: DC | PRN
  Administered 2016-03-21 (×2): 1 mg via INTRAVENOUS
  Filled 2016-03-21 (×2): qty 1

## 2016-03-21 MED ORDER — IOPAMIDOL (ISOVUE-300) INJECTION 61%
100.0000 mL | Freq: Once | INTRAVENOUS | Status: AC | PRN
  Administered 2016-03-21: 100 mL via INTRAVENOUS

## 2016-03-21 MED ORDER — PREDNISONE 20 MG PO TABS: ORAL_TABLET | ORAL | 0 refills | Status: AC

## 2016-03-21 MED ORDER — HYDROMORPHONE HCL 1 MG/ML IJ SOLN
1.0000 mg | Freq: Once | INTRAMUSCULAR | Status: AC
  Administered 2016-03-21: 1 mg via INTRAVENOUS
  Filled 2016-03-21: qty 1

## 2016-03-21 MED ORDER — METRONIDAZOLE 500 MG PO TABS
500.0000 mg | ORAL_TABLET | Freq: Once | ORAL | Status: AC
  Administered 2016-03-21: 500 mg via ORAL
  Filled 2016-03-21: qty 1

## 2016-03-21 MED ORDER — PREDNISONE 50 MG PO TABS
60.0000 mg | ORAL_TABLET | Freq: Once | ORAL | Status: AC
  Administered 2016-03-21: 60 mg via ORAL
  Filled 2016-03-21: qty 1

## 2016-03-21 MED ORDER — METRONIDAZOLE 500 MG PO TABS: 500.0000 mg | ORAL_TABLET | Freq: Two times a day (BID) | ORAL | 0 refills | Status: AC

## 2016-03-21 MED ORDER — FENTANYL CITRATE (PF) 100 MCG/2ML IJ SOLN
100.0000 ug | Freq: Once | INTRAMUSCULAR | Status: AC
  Administered 2016-03-21: 100 ug via INTRAVENOUS
  Filled 2016-03-21: qty 2

## 2016-03-21 MED ORDER — ONDANSETRON HCL 4 MG/2ML IJ SOLN
4.0000 mg | Freq: Once | INTRAMUSCULAR | Status: AC
  Administered 2016-03-21: 4 mg via INTRAVENOUS
  Filled 2016-03-21: qty 2

## 2016-03-21 MED ORDER — SODIUM CHLORIDE 0.9 % IV SOLN
Freq: Once | INTRAVENOUS | Status: AC
  Administered 2016-03-21: 02:00:00 via INTRAVENOUS

## 2016-03-21 MED ORDER — OXYCODONE HCL 5 MG PO TABS: 5.0000 mg | ORAL_TABLET | ORAL | 0 refills | Status: AC | PRN

## 2016-03-21 NOTE — ED Notes (Signed)
Pt seen by EDP prior to RN assessment, see MD notes, orders received and initiated, family at [facility], pt alert, NAD, calm, interactive, resps e/u, no dyspnea noted. Updated.

## 2016-03-21 NOTE — ED Notes (Signed)
Last ate 1500, c/o peri-umbilical abd pain, cramping w/o nvd, constipation or fever. Took xanax 6 hrs ago, did not take asacol today, currently treated with humira ("has been on prednisone, remicade and methotrexate in the past"), "dilaudid usually helps with pain", h/o abd surgeries, SBOs and NGTs. Family at [facility].

## 2016-03-21 NOTE — ED Notes (Signed)
MD notified that pt wishes to speak with him about reason for admission.

## 2016-03-21 NOTE — ED Notes (Signed)
Pt denies abd pain at this time; reports no recent v/d. Pt ambulating without difficulty, alert and oriented, NAD noted at this time.

## 2016-03-21 NOTE — ED Notes (Signed)
Pt and family still informed [facility] beds [facility].

## 2016-03-21 NOTE — ED Notes (Signed)
EDP in to update.  

## 2016-03-21 NOTE — ED Notes (Signed)
Resting, NAD, calm, "feel better", sipping on PO contrast (1/2 finished), pending CT.

## 2016-03-21 NOTE — ED Notes (Signed)
MD at bedside. 

## 2016-03-21 NOTE — ED Notes (Signed)
EDP into room, pt updated. 

## 2016-03-21 NOTE — ED Triage Notes (Signed)
Hx of chron's, reports flare up that started today.  Pt in obvious pain in triage.

## 2016-03-21 NOTE — ED Provider Notes (Signed)
Patient was awaiting transport to [facility] for crohns flare. Patient requested I speak to him as he wanted to leave AMA. On reevaluation, patient appeared well, no distress, MS appropriate. Abdominal exam benign. On review of records, he had not had abdominal pain for >4 hours without pain medication. CT/labs reviewed and no evidence of severe complications or systemic effects from flare. I discussed that I thought he should be admitted since that was what previous provider felt was best however he refused. He stated he knew when he was 'real sick' however this was not it and he watned to go home. He was competent to make his decisions and was allowed to leave AMA. I started him on flagyl, steroid taper and short course of pain meds. He will cal GI doctor tomorrow for appropriate follow up.    Marily Memos, MD 03/21/16 [phone]

## 2016-03-21 NOTE — ED Notes (Signed)
Starting PO contrast, no changes, pain 10/10.

## 2016-03-21 NOTE — ED Provider Notes (Addendum)
[facility] Provider Note: Roy Lopez Roy Woolverton, MD, FACEP By signing my name below, I, Roy HedgerElizabeth Lopez, attest that this documentation has been prepared under the direction and in the presence of Roy LibraJohn Adalae Baysinger, MD . Electronically Signed: Levon HedgerElizabeth Lopez, Scribe. 03/21/2016. 12:55 AM.   CSN: [id number] MRN: [id number] ARRIVAL: 03/20/16 at 2344  CHIEF COMPLAINT  Abdominal Pain   HISTORY OF PRESENT ILLNESS  Roy Lopez is a 35 y.o. male who presents to the Emergency Department complaining of sudden onset  abdominal pain primarily in the midline but also in the [facility] which began today. He describes the pain as severe, sharp and constant. He states that the pain is different from prior small bowel obstruction. Pt notes associated nausea; he is unsure of constipation. He denies vomiting, diarrhea, fever and chills.   Past Medical History:  Diagnosis Date  . Crohn's disease [facility](HCC)     Past Surgical History:  Procedure Laterality Date  . ABDOMINAL SURGERY      History reviewed. No pertinent family history.  Social History  Substance Use Topics  . Smoking status: Current Every Day Smoker    Packs/day: 0.50    Types: Cigarettes  . Smokeless tobacco: Not on file  . Alcohol use Yes    Prior to Admission medications   Medication Sig Start Date End Date Taking? Authorizing Provider  ALPRAZolam Prudy Feeler(XANAX) 0.5 MG tablet Take 0.5 mg by mouth at bedtime as needed for anxiety.   Yes Historical Provider, MD  oxyCODONE (ROXICODONE) 15 MG immediate release tablet Take 15 mg by mouth every 4 (four) hours as needed for pain.   Yes Historical Provider, MD  Adalimumab (HUMIRA [place]) Inject into the skin.    Historical Provider, MD  ALBUTEROL IN Inhale into the lungs.    Historical Provider, MD  RABEprazole (ACIPHEX) 20 MG tablet Take 20 mg by mouth daily.    Historical Provider, MD    Allergies Ciprofloxacin   REVIEW OF SYSTEMS  Negative except as noted here or in the History of Present  Illness.   PHYSICAL EXAMINATION  Initial Vital Signs Blood pressure 124/85, pulse 66, temperature 98.2 F (36.8 C), temperature source Oral, resp. rate 18, height 5\' 10"  (1.778 m), weight 150 lb (68 kg), SpO2 100 %.  Examination General: Well-developed, well-nourished male in no acute distress; appearance consistent with age of record HENT: normocephalic; atraumatic Eyes: pupils equal, round and reactive to light; extraocular muscles intact Neck: supple Heart: regular rate and rhythm; no murmurs, rubs or gallops Lungs: clear to auscultation bilaterally Abdomen: soft; nondistended; midline tenderness, most prominent in the epigastrium; no masses or hepatosplenomegaly; bowel sounds present Extremities: No deformity; full range of motion; pulses normal Neurologic: Awake, alert and oriented; motor function intact in all extremities and symmetric; no facial droop Skin: Warm and dry Psychiatric: Normal mood and flat affect   RESULTS  Summary of this visit's results, reviewed by myself:   EKG Interpretation  Date/Time:    Ventricular Rate:    PR Interval:    QRS Duration:   QT Interval:    QTC Calculation:   R Axis:     Text Interpretation:        Laboratory Studies: Results for orders placed or performed during the hospital encounter of 03/21/16 (from the past 24 hour(s))  CBC with Differential/Platelet     Status: Abnormal   Collection Time: 03/21/16  1:45 AM  Result Value Ref Range   WBC 13.0 (H) 4.0 - 10.5 K/uL  RBC 4.37 4.22 - 5.81 MIL/uL   Hemoglobin 12.9 (L) 13.0 - 17.0 g/dL   HCT 40.9 (L) 81.1 - 91.4 %   MCV 88.3 78.0 - 100.0 fL   MCH 29.5 26.0 - 34.0 pg   MCHC 33.4 30.0 - 36.0 g/dL   RDW 78.2 95.6 - 21.3 %   Platelets 332 150 - 400 K/uL   Neutrophils Relative % 59 %   Neutro Abs 7.6 1.7 - 7.7 K/uL   Lymphocytes Relative 29 %   Lymphs Abs 3.8 0.7 - 4.0 K/uL   Monocytes Relative 7 %   Monocytes Absolute 1.0 0.1 - 1.0 K/uL   Eosinophils Relative 5 %    Eosinophils Absolute 0.6 0.0 - 0.7 K/uL   Basophils Relative 0 %   Basophils Absolute 0.1 0.0 - 0.1 K/uL  Comprehensive metabolic panel     Status: Abnormal   Collection Time: 03/21/16  1:45 AM  Result Value Ref Range   Sodium 139 135 - 145 mmol/L   Potassium 3.3 (L) 3.5 - 5.1 mmol/L   Chloride 103 101 - 111 mmol/L   CO2 30 22 - 32 mmol/L   Glucose, Bld 113 (H) 65 - 99 mg/dL   BUN 13 6 - 20 mg/dL   Creatinine, Ser 0.86 0.61 - 1.24 mg/dL   Calcium 9.0 8.9 - 57.8 mg/dL   Total Protein 6.7 6.5 - 8.1 g/dL   Albumin 3.8 3.5 - 5.0 g/dL   AST 14 (L) 15 - 41 U/L   ALT 11 (L) 17 - 63 U/L   Alkaline Phosphatase 58 38 - 126 U/L   Total Bilirubin 0.6 0.3 - 1.2 mg/dL   GFR calc non Af Amer >60 >60 mL/min   GFR calc Af Amer >60 >60 mL/min   Anion gap 6 5 - 15  Lipase, blood     Status: None   Collection Time: 03/21/16  1:45 AM  Result Value Ref Range   Lipase 22 11 - 51 U/L  I-Stat CG4 Lactic Acid, ED     Status: None   Collection Time: 03/21/16  3:49 AM  Result Value Ref Range   Lactic Acid, Venous 0.58 0.5 - 1.9 mmol/L   Imaging Studies: Ct Abdomen Pelvis W Contrast  Result Date: 03/21/2016 CLINICAL DATA:  Sudden onset of abdominal pain today. Pain in the midline, left and right lower quadrants. History of Crohn's disease. EXAM: CT ABDOMEN AND PELVIS WITH CONTRAST TECHNIQUE: Multidetector CT imaging of the abdomen and pelvis was performed using the standard protocol following bolus administration of intravenous contrast. CONTRAST:  ISOVUE-300 IOPAMIDOL (ISOVUE-300) INJECTION 61% COMPARISON:  Report from CT 11/19/2013, images not available. CT 10/08/2010. FINDINGS: Lower chest: Small clustered nodules in the right middle lobe. Mild dependent right lower lobe atelectasis. Hepatobiliary: Liver is normal in size. No focal lesion. The gallbladder is contracted. No calcified stone. No biliary dilatation. Pancreas: Unremarkable. No pancreatic ductal dilatation or surrounding inflammatory  changes. Spleen: Normal. Adrenals/Urinary Tract: Normal adrenal glands. Calcification in the upper right kidney may be parenchymal. No hydronephrosis or perinephric edema. Urinary bladder is physiologically distended. Stomach/Bowel: No evidence of gastric inflammation. Duodenum appears normal. Detailed evaluation of the remaining small and large bowel with limited by paucity of intra-abdominal fat. Administered enteric contrast reaches the mid small bowel. No evidence of bowel obstruction. Short-segment loop of small bowel with relative wall thickening compared to adjacent bowel loops is seen in the central abdomen image 51 series 2. There is question of mild adjacent  mesenteric edema. A second area of relative bowel wall thickening in the distal/ terminal ileum image 58 series 2. These findings are mild. Enteric sutures noted in the central upper abdomen. Portions of the appendix are tentatively identified and normal. Vascular/Lymphatic: No significant vascular findings are present. Multiple prominent mesenteric lymph nodes. No retroperitoneal adenopathy. Reproductive: Prostate is unremarkable. Other: No free air. Trace pelvic free fluid. No intra-abdominal abscess. Musculoskeletal: There are no acute or suspicious osseous abnormalities. IMPRESSION: Findings suspicious for two short-segment regions of active Crohn's in the abdomen with relative wall thickening and minimal adjacent mesenteric edema, 1 in the distal ileum, 1 in the central abdomen. There is no bowel obstruction. Electronically Signed   By: Rubye Oaks M.D.   On: 03/21/2016 05:14    ED COURSE  Nursing notes and initial vitals signs, including pulse oximetry, reviewed.  Vitals:   03/21/16 0345 03/21/16 0400 03/21/16 0415 03/21/16 0430  BP: 127/89 113/73 115/75 118/67  Pulse: 60 (!) 54 (!) 59 62  Resp:      Temp:      TempSrc:      SpO2: 98% 97% 97% 95%  Weight:      Height:        PROCEDURES    ED DIAGNOSES     ICD-9-CM  ICD-10-CM   1. Exacerbation of Crohn's disease of small intestine, without complications (HCC) 555.0 K50.00     I personally performed the services described in this documentation, which was scribed in my presence. The recorded information has been reviewed and is accurate.    Roy Libra, MD 03/21/16 Jeralyn Bennett    Roy Libra, MD 03/21/16 [phone]

## 2017-03-03 ENCOUNTER — Ambulatory Visit: Payer: BLUE CROSS/BLUE SHIELD | Admitting: Neurology

## 2018-05-17 IMAGING — CT CT ABD-PELV W/ CM
2 of 4 series · 15 of 46 positions shown, 17 images · IV contrast (APPLIED)
Comparison: Report from CT 11/19/2013, images not available. CT
10/08/2010.

CLINICAL DATA: Sudden onset of abdominal pain today. Pain in the
midline, left and right lower quadrants. History of Crohn's disease.

EXAM:
CT ABDOMEN AND PELVIS WITH CONTRAST
TECHNIQUE: Multidetector CT imaging of the abdomen and pelvis was performed
using the standard protocol following bolus administration of
intravenous contrast.
CONTRAST:  100mL 1ENBAU-AWW IOPAMIDOL (1ENBAU-AWW) INJECTION 61%

[Series 2: axial st · axial · 0.69mm/px · z∈[-777,-392]mm · 12 of 93 slices shown, 14 images]
[im 8/93  soft-tissue]
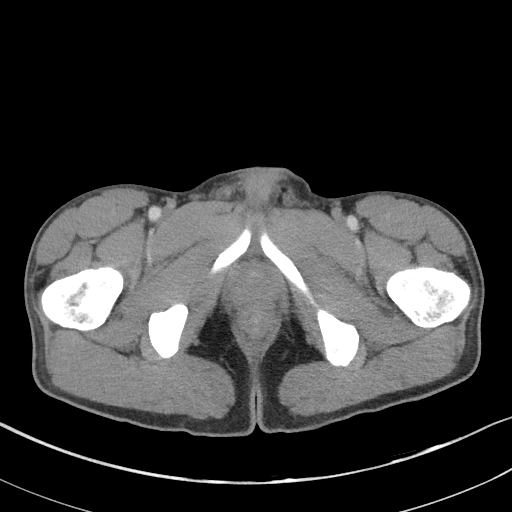
[im 8/93  bone]
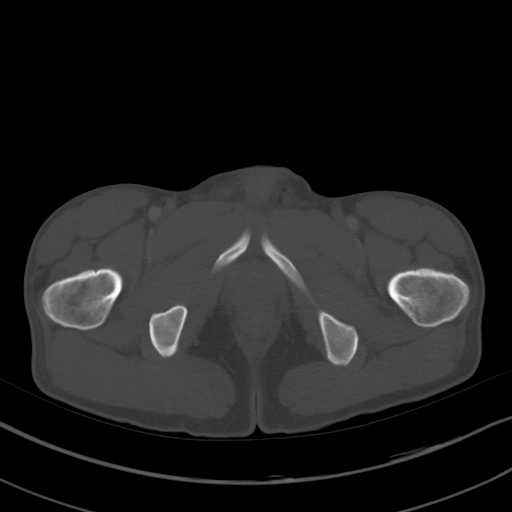
[im 15/93  soft-tissue]
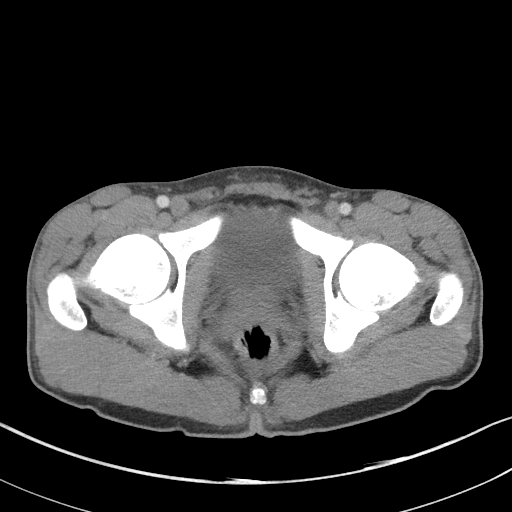
[im 22/93  soft-tissue]
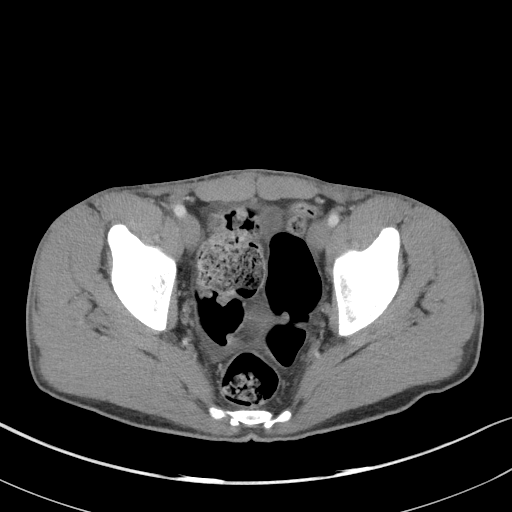
[im 29/93  soft-tissue]
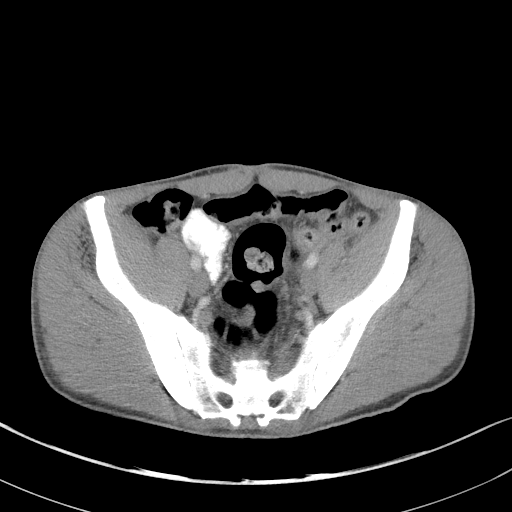
[im 36/93  soft-tissue]
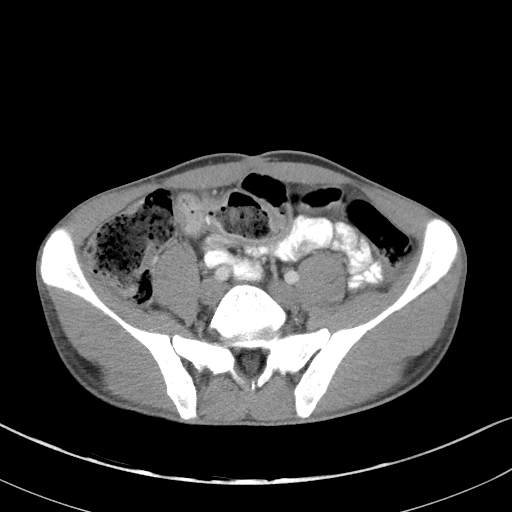
[im 43/93  soft-tissue]
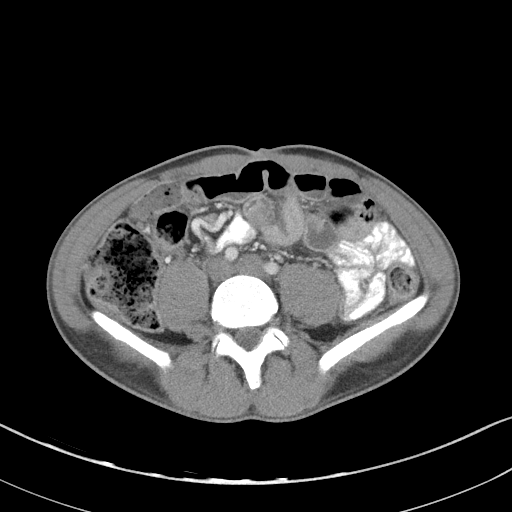
[im 50/93  soft-tissue]
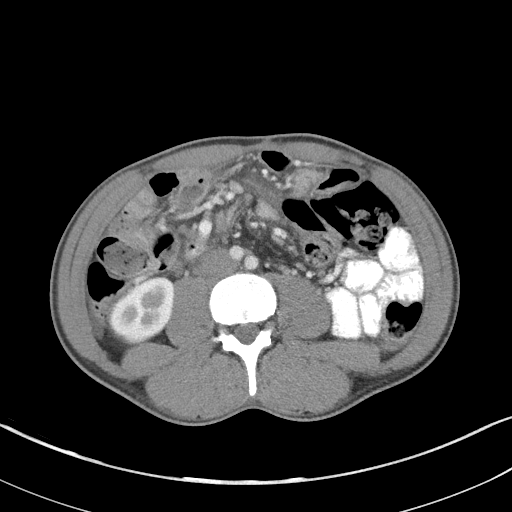
[im 57/93  soft-tissue]
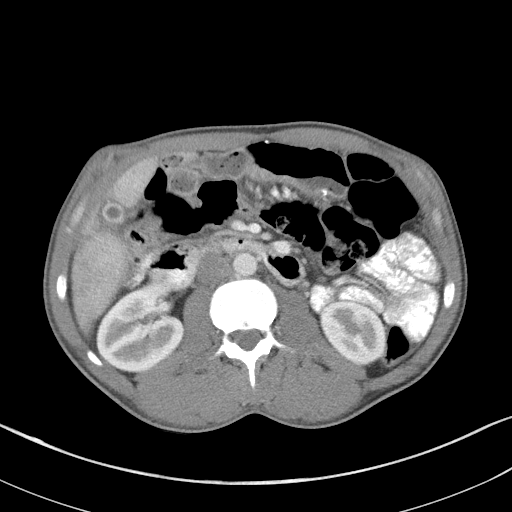
[im 64/93  soft-tissue]
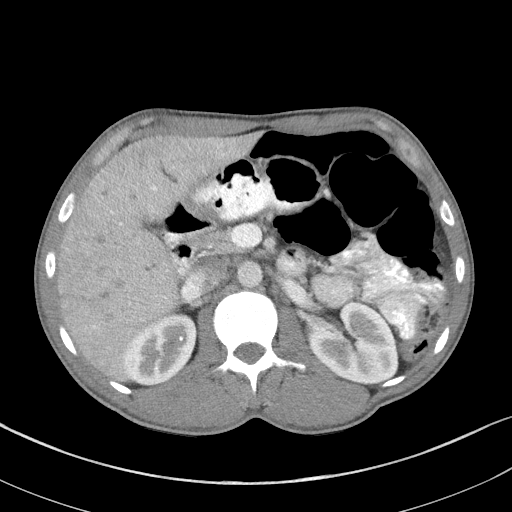
[im 64/93  bone]
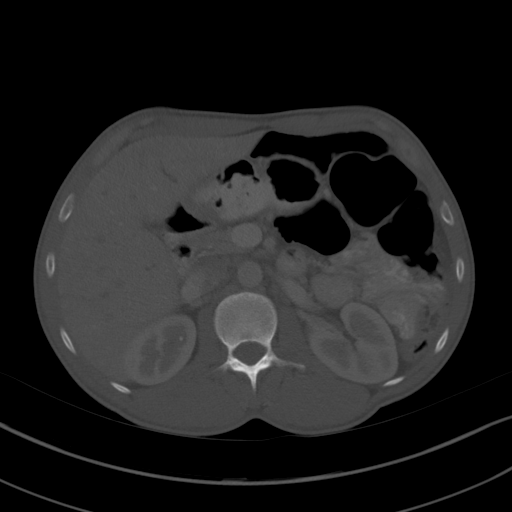
[im 71/93  soft-tissue]
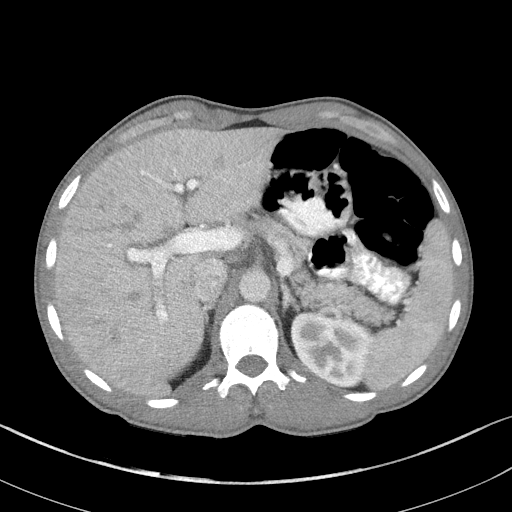
[im 78/93  soft-tissue]
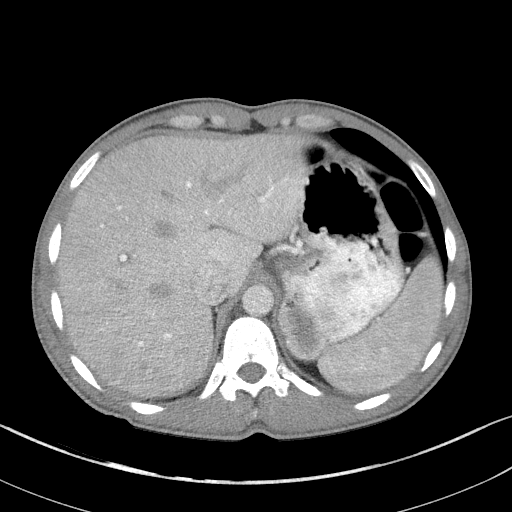
[im 85/93  soft-tissue]
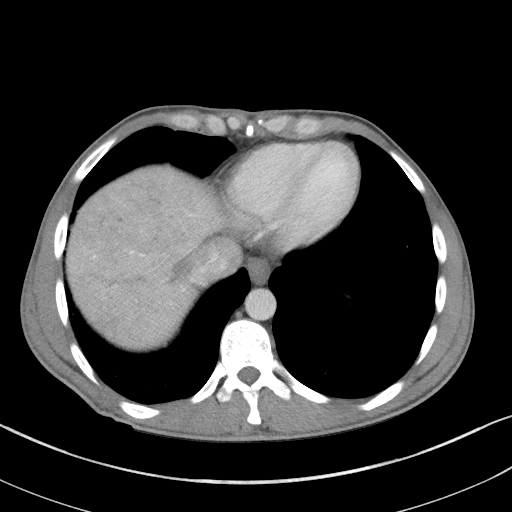

[Series 5: coronal st · coronal · 0.63mm/px · 3 of 101 slices shown]
[im 34/101  soft-tissue]
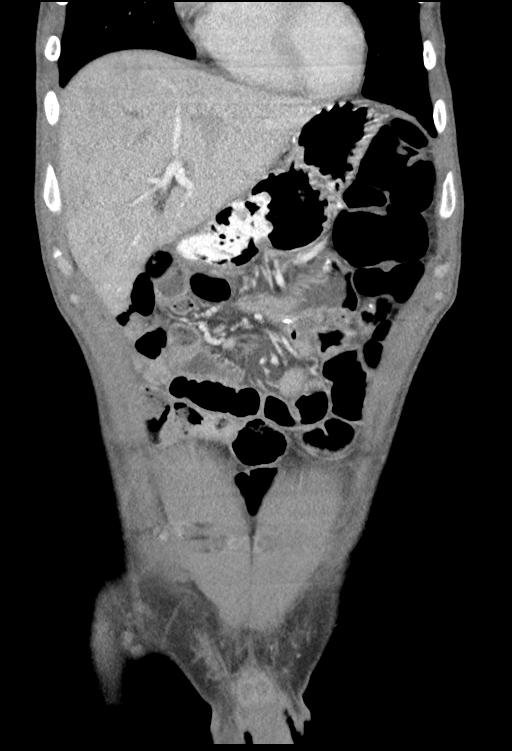
[im 45/101  soft-tissue]
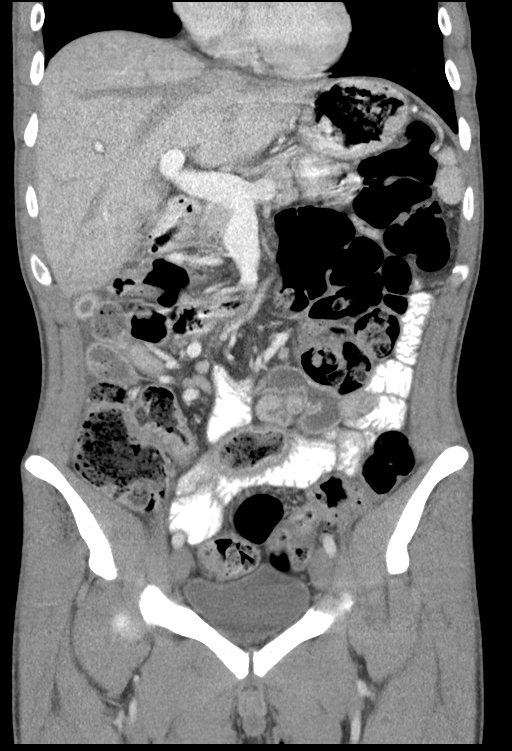
[im 56/101  soft-tissue]
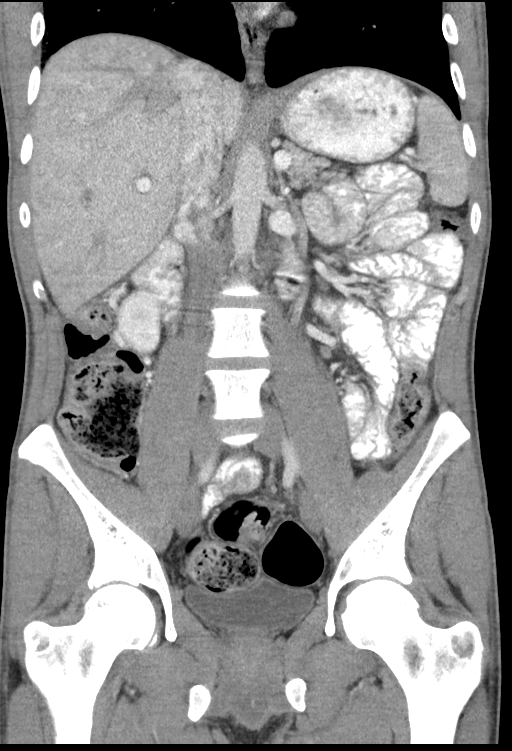

[15 of 46 positions shown; findings below may reference images not displayed]

FINDINGS: Lower chest: Small clustered nodules in the right middle lobe. Mild
dependent right lower lobe atelectasis.

Hepatobiliary: Liver is normal in size. No focal lesion. The
gallbladder is contracted. No calcified stone. No biliary
dilatation.

Pancreas: Unremarkable. No pancreatic ductal dilatation or
surrounding inflammatory changes.

Spleen: Normal.

Adrenals/Urinary Tract: Normal adrenal glands. Calcification in the
upper right kidney may be parenchymal. No hydronephrosis or
perinephric edema. Urinary bladder is physiologically distended.

Stomach/Bowel: No evidence of gastric inflammation. Duodenum appears
normal. Detailed evaluation of the remaining small and large bowel
with limited by paucity of intra-abdominal fat. Administered enteric
contrast reaches the mid small bowel. No evidence of bowel
obstruction. Short-segment loop of small bowel with relative wall
thickening compared to adjacent bowel loops is seen in the central
abdomen image 51 series 2. There is question of mild adjacent
mesenteric edema. A second area of relative bowel wall thickening in
the distal/ terminal ileum image 58 series 2. These findings are
mild. Enteric sutures noted in the central upper abdomen. Portions
of the appendix are tentatively identified and normal.

Vascular/Lymphatic: No significant vascular findings are present.
Multiple prominent mesenteric lymph nodes. No retroperitoneal
adenopathy.

Reproductive: Prostate is unremarkable.

Other: No free air. Trace pelvic free fluid. No intra-abdominal
abscess.

Musculoskeletal: There are no acute or suspicious osseous
abnormalities.
IMPRESSION: Findings suspicious for two short-segment regions of active Crohn's
in the abdomen with relative wall thickening and minimal adjacent
mesenteric edema, 1 in the distal ileum, 1 in the central abdomen.
There is no bowel obstruction.
